# Patient Record
Sex: Male | Born: 1960 | Race: Black or African American | Hispanic: No | Marital: Married | Smoking: Former smoker
Health system: Southern US, Community
[De-identification: ages and names within clinical notes are randomized; demographics above are authoritative.]

## PROBLEM LIST (undated history)

## (undated) DIAGNOSIS — E291 Testicular hypofunction: Secondary | ICD-10-CM

## (undated) DIAGNOSIS — Z973 Presence of spectacles and contact lenses: Secondary | ICD-10-CM

## (undated) DIAGNOSIS — E785 Hyperlipidemia, unspecified: Secondary | ICD-10-CM

## (undated) DIAGNOSIS — J189 Pneumonia, unspecified organism: Secondary | ICD-10-CM

## (undated) DIAGNOSIS — T7840XA Allergy, unspecified, initial encounter: Secondary | ICD-10-CM

## (undated) HISTORY — DX: Presence of spectacles and contact lenses: Z97.3

## (undated) HISTORY — DX: Testicular hypofunction: E29.1

## (undated) HISTORY — DX: Pneumonia, unspecified organism: J18.9

## (undated) HISTORY — DX: Hyperlipidemia, unspecified: E78.5

## (undated) HISTORY — DX: Allergy, unspecified, initial encounter: T78.40XA

---

## 2004-01-16 ENCOUNTER — Emergency Department (HOSPITAL_COMMUNITY): Admission: EM | Admit: 2004-01-16 | Discharge: 2004-01-16 | Payer: Self-pay | Admitting: Emergency Medicine

## 2011-10-05 ENCOUNTER — Ambulatory Visit (INDEPENDENT_AMBULATORY_CARE_PROVIDER_SITE_OTHER): Payer: PRIVATE HEALTH INSURANCE | Admitting: Medical

## 2011-10-05 ENCOUNTER — Other Ambulatory Visit: Payer: Self-pay

## 2011-10-05 ENCOUNTER — Encounter: Payer: Self-pay | Admitting: Medical

## 2011-10-05 VITALS — BP 110/78 | HR 88 | Temp 98.6°F | Resp 16 | Ht 67.0 in | Wt 154.0 lb

## 2011-10-05 DIAGNOSIS — E291 Testicular hypofunction: Secondary | ICD-10-CM

## 2011-10-05 DIAGNOSIS — K029 Dental caries, unspecified: Secondary | ICD-10-CM

## 2011-10-05 DIAGNOSIS — R5383 Other fatigue: Secondary | ICD-10-CM

## 2011-10-05 DIAGNOSIS — R062 Wheezing: Secondary | ICD-10-CM

## 2011-10-05 DIAGNOSIS — Z0000 Encounter for general adult medical examination without abnormal findings: Secondary | ICD-10-CM

## 2011-10-05 DIAGNOSIS — H539 Unspecified visual disturbance: Secondary | ICD-10-CM

## 2011-10-05 DIAGNOSIS — Z125 Encounter for screening for malignant neoplasm of prostate: Secondary | ICD-10-CM

## 2011-10-05 DIAGNOSIS — R5381 Other malaise: Secondary | ICD-10-CM

## 2011-10-05 DIAGNOSIS — F17200 Nicotine dependence, unspecified, uncomplicated: Secondary | ICD-10-CM

## 2011-10-05 LAB — CBC WITH DIFFERENTIAL/PLATELET
HCT: 47.5 % (ref 39.0–52.0)
Hemoglobin: 16 g/dL (ref 13.0–17.0)
Lymphocytes Relative: 53 % — ABNORMAL HIGH (ref 12–46)
Monocytes Absolute: 0.3 10*3/uL (ref 0.1–1.0)
Monocytes Relative: 7 % (ref 3–12)
Neutro Abs: 1.5 10*3/uL — ABNORMAL LOW (ref 1.7–7.7)
WBC: 3.9 10*3/uL — ABNORMAL LOW (ref 4.0–10.5)

## 2011-10-05 LAB — TESTOSTERONE: Testosterone: 466.75 ng/dL (ref 250–890)

## 2011-10-05 LAB — LIPID PANEL
HDL: 54 mg/dL (ref >39)
LDL Cholesterol: 167 mg/dL — ABNORMAL HIGH (ref 0–99)
Triglycerides: 79 mg/dL (ref <150)
VLDL: 16 mg/dL (ref 0–40)

## 2011-10-05 LAB — POCT URINALYSIS DIPSTICK
Bilirubin, UA: NEGATIVE
Blood, UA: NEGATIVE
Ketones, UA: NEGATIVE
Nitrite, UA: NEGATIVE
pH, UA: 5

## 2011-10-05 LAB — COMPREHENSIVE METABOLIC PANEL
BUN: 10 mg/dL (ref 6–23)
CO2: 22 mEq/L (ref 19–32)
Calcium: 9.7 mg/dL (ref 8.4–10.5)
Chloride: 105 mEq/L (ref 96–112)
Creat: 0.99 mg/dL (ref 0.50–1.35)

## 2011-10-05 MED ORDER — AZITHROMYCIN 500 MG PO TABS: 500.0000 mg | ORAL_TABLET | Freq: Every day | ORAL | Status: AC

## 2011-10-05 MED ORDER — ALBUTEROL SULFATE HFA 108 (90 BASE) MCG/ACT IN AERS: 2.0000 | INHALATION_SPRAY | Freq: Four times a day (QID) | RESPIRATORY_TRACT | Status: AC | PRN

## 2011-10-05 NOTE — Progress Notes (Signed)
Subjective:   HPI  Russell Sharp is a 51 y.o. male who presents for a complete physical.  New patient today.  Was seeing Urgent Care prior.  He is a some time cigar smoker, notes hx/o frequent respiratory illness.  Lately been having cough about 5 days ago, some SOB and fatigue.  Had low grade fever last week, some wheezing.  He notes that he does get some wheezing in spring with pollen.  Denis hx/o asthma.  No prior eval for asthma.   He also wonders if the fatigue is related to his testosterone.  He has a hx/o low testosterone, used Androgel briefly in the past before he lost his insurance.  Otherwise has been in good health.     Last preventative care over 1 year ago, last Tdap about 2 years ago, last dental visit 2010, last eye visit - never.  Reviewed their medical, surgical, family, social, medication, and allergy history and updated chart as appropriate.  Past Medical History  Diagnosis Date  . Pneumonia     hospitalized age 59  . Hypogonadism male     prior Androgel use x 28mo  . Chest pain 2008    EKG and stress testing    History reviewed. No pertinent past surgical history.  Family History  Problem Relation Age of Onset  . Cancer Mother     died of lung cancer  . Hypertension Mother   . Other Father     died in the [employer]  . Diabetes Father   . Hyperlipidemia Brother   . Cancer Maternal Aunt     died of lung cancer  . Stroke Neg Hx   . Heart disease Neg Hx     History   Social History  . Marital Status: Single    Spouse Name: N/A    Number of Children: N/A  . Years of Education: N/A   Occupational History  . 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 Smoking status: Current Some Day Smoker -- 0.2 packs/day    Types: Cigars  . Smokeless tobacco: Not on file  . Alcohol Use: 0.6 oz/week    1 Glasses of wine per week  . Drug Use: No  . Sexually Active: Not on file   Other Topics Concern  . Not on file    Social History Narrative   Married, 3 children, ages 62yo, 47yo, [place], exercise - walking    No current outpatient prescriptions on file prior to visit.    No Known Allergies  Review of Systems Constitutional: -fever, -chills, -sweats, -unexpected weight change, -anorexia, +fatigue Allergy: -sneezing, -itching, +congestion Dermatology: denies changing moles, rash, lumps, new worrisome lesions ENT: +runny nose, -ear pain, -sore throat, -hoarseness, -sinus pain, -teeth pain, -tinnitus, -hearing loss, -epistaxis Cardiology:  -chest pain, -palpitations, -edema, -orthopnea, -paroxysmal nocturnal dyspnea Respiratory: +cough, -shortness of breath, -dyspnea on exertion, +wheezing, -hemoptysis Gastroenterology: -abdominal pain, -nausea, -vomiting, -diarrhea, -constipation, -blood in stool, -changes in bowel movement, -dysphagia Hematology: -bleeding or bruising problems Musculoskeletal: -arthralgias, -myalgias, -joint swelling, +back pain, -neck pain, -cramping, -gait changes Ophthalmology: -vision changes, -eye redness, -itching, -discharge Urology: -dysuria, -difficulty urinating, -hematuria, -urinary frequency, -urgency, incontinence Neurology: +headache, -weakness, -tingling, -numbness, -speech abnormality, -memory loss, -falls, -dizziness Psychology:  -depressed mood, -agitation, +sleep problems        Objective:   Physical Exam  Filed Vitals:   10/05/11 0927  BP: 110/78  Pulse: 88  Temp: 98.6  F (37 C)  Resp: 16    General appearance: alert, no distress, WD/WN, lean black male Skin: left neck with small pedunculated skin tag, few other small Fugate flat macules, benign appearing HEENT: normocephalic, conjunctiva/corneas normal, sclerae anicteric, PERRLA, EOMi, nares patent, no discharge or erythema, pharynx normal Oral cavity: MMM, tongue normal, teeth with several teeth in obvious decay bilat upper and lower Neck: supple, no lymphadenopathy, no thyromegaly, no masses, normal  ROM, no bruits Chest: non tender, normal shape and expansion Heart: RRR, normal S1, S2, no murmurs Lungs: few scattered wheezes, few upper field rhonchi, no rales Abdomen: +bs, soft, non tender, non distended, no masses, no hepatomegaly, no splenomegaly, no bruits Back: non tender, normal ROM, no scoliosis Musculoskeletal: upper extremities non tender, no obvious deformity, normal ROM throughout, lower extremities non tender, no obvious deformity, normal ROM throughout Extremities: no edema, no cyanosis, no clubbing Pulses: 2+ symmetric, upper and lower extremities, normal cap refill Neurological: alert, oriented x 3, CN2-12 intact, strength normal upper extremities and lower extremities, sensation normal throughout, DTRs 2+ throughout, no cerebellar signs, gait normal Psychiatric: normal affect, behavior normal, pleasant  GU: normal male external genitalia, uncircumcised, nontender, no masses, no hernia, no lymphadenopathy Rectal: anus normal appearing, prostate  WNL, no nodules or tenderness, no obvious hemorrhoids   Assessment and Plan :    Encounter Diagnoses  Name Primary?  . Routine general medical examination at a health care facility Yes  . Hypogonadism male   . Screening for prostate cancer   . Fatigue   . Tobacco use disorder   . Vision changes   . Dental caries   . Wheezing    Physical exam - discussed healthy lifestyle, diet, exercise, preventative care, vaccinations, and addressed their concerns.    Labs today.  He is reportedly UTD on Tdap, will get flu shot in fall.  Advised he see dentist soon for caries and routine exam.  We will refer to ophthalmology for first official vision screening.    Wheezing, fatigue - will treat for bronchitis with Azithromycin and Albuterol inhaler.  Discussed proper use of inhaler.  Advised again to stop tobacco. CXR sent for overread.  Follow-up pending labs

## 2011-10-05 NOTE — Patient Instructions (Signed)
Recommendations today: 1) see an eye doctor for a full vision exam.   2) twice yearly cleanings/visit with dentist 3) i recommend you stop tobacco completely 4) flu shot yearly in October/November 5) we will call with lab results  Preventative Care for Adults, Male       REGULAR HEALTH EXAMS:  A routine yearly physical is a good way to check in with your primary care provider about your health and preventive screening. It is also an opportunity to share updates about your health and any concerns you have, and receive a thorough all-over exam.   Most health insurance companies pay for at least some preventative services.  Check with your health plan for specific coverages.  WHAT PREVENTATIVE SERVICES DO MEN NEED?  Adult men should have their weight and blood pressure checked regularly.   Men age 13 and older should have their cholesterol levels checked regularly.  Beginning at age 77 and continuing to age 51, men should be screened for colorectal cancer.  Certain people should may need continued testing until age 49.  Other cancer screening may include exams for testicular and prostate cancer.  Updating vaccinations is part of preventative care.  Vaccinations help protect against diseases such as the flu.  Lab tests are generally done as part of preventative care to screen for anemia and blood disorders, to screen for problems with the kidneys and liver, to screen for bladder problems, to check blood sugar, and to check your cholesterol level.  Preventative services generally include counseling about diet, exercise, avoiding tobacco, drugs, excessive alcohol consumption, and sexually transmitted infections.    GENERAL RECOMMENDATIONS FOR GOOD HEALTH:  Healthy diet:  Eat a variety of foods, including fruit, vegetables, animal or vegetable protein, such as meat, fish, chicken, and eggs, or beans, lentils, tofu, and grains, such as rice.  Drink plenty of water daily.  Decrease  saturated fat in the diet, avoid lots of red meat, processed foods, sweets, fast foods, and fried foods.  Exercise:  Aerobic exercise helps maintain good heart health. At least 30-40 minutes of moderate-intensity exercise is recommended. For example, a brisk walk that increases your heart rate and breathing. This should be done on most days of the week.   Find a type of exercise or a variety of exercises that you enjoy so that it becomes a part of your daily life.  Examples are running, walking, swimming, water aerobics, and biking.  For motivation and support, explore group exercise such as aerobic class, spin class, Zumba, Yoga,or  martial arts, etc.    Set exercise goals for yourself, such as a certain weight goal, walk or run in a race such as a 5k walk/run.  Speak to your primary care provider about exercise goals.  Disease prevention:  If you smoke or chew tobacco, find out from your caregiver how to quit. It can literally save your life, no matter how long you have been a tobacco user. If you do not use tobacco, never begin.   Maintain a healthy diet and normal weight. Increased weight leads to problems with blood pressure and diabetes.   The Body Mass Index or BMI is a way of measuring how much of your body is fat. Having a BMI above 27 increases the risk of heart disease, diabetes, hypertension, stroke and other problems related to obesity. Your caregiver can help determine your BMI and based on it develop an exercise and dietary program to help you achieve or maintain this important measurement at  a healthful level.  High blood pressure causes heart and blood vessel problems.  Persistent high blood pressure should be treated with medicine if weight loss and exercise do not work.   Fat and cholesterol leaves deposits in your arteries that can block them. This causes heart disease and vessel disease elsewhere in your body.  If your cholesterol is found to be high, or if you have heart  disease or certain other medical conditions, then you may need to have your cholesterol monitored frequently and be treated with medication.   Ask if you should have a stress test if your history suggests this. A stress test is a test done on a treadmill that looks for heart disease. This test can find disease prior to there being a problem.  Avoid drinking alcohol in excess (more than two drinks per day).  Avoid use of street drugs. Do not share needles with anyone. Ask for professional help if you need assistance or instructions on stopping the use of alcohol, cigarettes, and/or drugs.  Brush your teeth twice a day with fluoride toothpaste, and floss once a day. Good oral hygiene prevents tooth decay and gum disease. The problems can be painful, unattractive, and can cause other health problems. Visit your dentist for a routine oral and dental check up and preventive care every 6-12 months.   Look at your skin regularly.  Use a mirror to look at your back. Notify your caregivers of changes in moles, especially if there are changes in shapes, colors, a size larger than a pencil eraser, an irregular border, or development of new moles.  Safety:  Use seatbelts 100% of the time, whether driving or as a passenger.  Use safety devices such as hearing protection if you work in environments with loud noise or significant background noise.  Use safety glasses when doing any work that could send debris in to the eyes.  Use a helmet if you ride a bike or motorcycle.  Use appropriate safety gear for contact sports.  Talk to your caregiver about gun safety.  Use sunscreen with a SPF (or skin protection factor) of 15 or greater.  Lighter skinned people are at a greater risk of skin cancer. Don't forget to also wear sunglasses in order to protect your eyes from too much damaging sunlight. Damaging sunlight can accelerate cataract formation.   Practice safe sex. Use condoms. Condoms are used for birth control and  to help reduce the spread of sexually transmitted infections (or STIs).  Some of the STIs are gonorrhea (the clap), chlamydia, syphilis, trichomonas, herpes, HPV (human papilloma virus) and HIV (human immunodeficiency virus) which causes AIDS. The herpes, HIV and HPV are viral illnesses that have no cure. These can result in disability, cancer and death.   Keep carbon monoxide and smoke detectors in your home functioning at all times. Change the batteries every 6 months or use a model that plugs into the wall.   Vaccinations:  Stay up to date with your tetanus shots and other required immunizations. You should have a booster for tetanus every 10 years. Be sure to get your flu shot every year, since 5%-20% of the [place] population comes down with the flu. The flu vaccine changes each year, so being vaccinated once is not enough. Get your shot in the fall, before the flu season peaks.   Other vaccines to consider:  Pneumococcal vaccine to protect against certain types of pneumonia.  This is normally recommended for adults age 60 or older.  However, adults younger than 49 years old with certain underlying conditions such as diabetes, heart or lung disease should also receive the vaccine.  Shingles vaccine to protect against Varicella Zoster if you are older than age 65, or younger than 49 years old with certain underlying illness.  Hepatitis A vaccine to protect against a form of infection of the liver by a virus acquired from food.  Hepatitis B vaccine to protect against a form of infection of the liver by a virus acquired from blood or body fluids, particularly if you work in health care.  If you plan to travel internationally, check with your local health department for specific vaccination recommendations.  Cancer Screening:  Most routine colon cancer screening begins at the age of 62. On a yearly basis, doctors may provide special easy to use take-home tests to check for hidden blood in the  stool. Sigmoidoscopy or colonoscopy can detect the earliest forms of colon cancer and is life saving. These tests use a small camera at the end of a tube to directly examine the colon. Speak to your caregiver about this at age 84, when routine screening begins (and is repeated every 5 years unless early forms of pre-cancerous polyps or small growths are found).   At the age of 60 men usually start screening for prostate cancer every year. Screening may begin at a younger age for those with higher risk. Those at higher risk include African-Americans or having a family history of prostate cancer. There are two types of tests for prostate cancer:   Prostate-specific antigen (PSA) testing. Recent studies raise questions about prostate cancer using PSA and you should discuss this with your caregiver.   Digital rectal exam (in which your doctor's lubricated and gloved finger feels for enlargement of the prostate through the anus).   Screening for testicular cancer.  Do a monthly exam of your testicles. Gently roll each testicle between your thumb and fingers, feeling for any abnormal lumps. The best time to do this is after a hot shower or bath when the tissues are looser. Notify your caregivers of any lumps, tenderness or changes in size or shape immediately.

## 2011-10-12 ENCOUNTER — Telehealth: Payer: Self-pay | Admitting: Family Medicine

## 2011-10-12 ENCOUNTER — Other Ambulatory Visit: Payer: Self-pay | Admitting: Medical

## 2011-10-12 MED ORDER — ATORVASTATIN CALCIUM 40 MG PO TABS: 40.0000 mg | ORAL_TABLET | Freq: Every day | ORAL | Status: DC

## 2011-10-12 NOTE — Telephone Encounter (Signed)
Message copied by Janeice Robinson on Tue Oct 12, 2011 10:11 AM ------      Message from: Aleen Campi, Kermit Balo      Created: Fri Oct 08, 2011  5:12 PM       pls call [facility] and ask if they can changes the "history" on the CXR.  It says "PE" but should be wheezing, and fatigue.                     PE makes it sound like I was worried about a PE.              Ask if they will reprint. Let the pt know that the CXR was normal.

## 2011-10-12 NOTE — Telephone Encounter (Signed)
Patient was notified of his CXR report. CLS

## 2011-11-01 ENCOUNTER — Encounter: Payer: Self-pay | Admitting: Medical

## 2012-05-12 ENCOUNTER — Encounter: Payer: Self-pay | Admitting: Medical

## 2012-05-12 ENCOUNTER — Ambulatory Visit (INDEPENDENT_AMBULATORY_CARE_PROVIDER_SITE_OTHER): Payer: PRIVATE HEALTH INSURANCE | Admitting: Medical

## 2012-05-12 VITALS — BP 120/80 | HR 80 | Temp 97.5°F | Resp 16 | Wt 158.0 lb

## 2012-05-12 DIAGNOSIS — Z23 Encounter for immunization: Secondary | ICD-10-CM

## 2012-05-12 DIAGNOSIS — E785 Hyperlipidemia, unspecified: Secondary | ICD-10-CM

## 2012-05-12 LAB — LIPID PANEL
Cholesterol: 266 mg/dL — ABNORMAL HIGH (ref 0–200)
HDL: 61 mg/dL (ref >39)
Triglycerides: 112 mg/dL (ref <150)

## 2012-05-12 NOTE — Progress Notes (Signed)
Subjective:   HPI  Russell Sharp is a 51 y.o. male who presents for f/u.  I saw him for physical March 2013.  I started him on Lipitor 40mg  daily at that time and advise diet changes due to his then current diet, tobacco use and LDL >160.  He has since cut out most red meat, is compliant with Lipitor, and fasting here for recheck today.  He did see dentist as advised in march for caries.  Has additional appt planned.  Never got a call about ophthalmology screening.    He did quit cigars.   No other c/o.  The following portions of the patient's history were reviewed and updated as appropriate: allergies, current medications, past family history, past medical history, past social history, past surgical history and problem list.  Past Medical History  Diagnosis Date  . Pneumonia     hospitalized age 31  . Hypogonadism male     prior Androgel use x 53mo  . Chest pain 2008    EKG and stress testing    No Known Allergies   Review of Systems ROS reviewed and was negative other than noted in HPI or above.    Objective:   Physical Exam  General appearance: alert, no distress, WD/WN Heart: RRR, normal S1, S2, no murmurs Lungs: CTA bilaterally, no wheezes, rhonchi, or rales Abdomen: +bs, soft, non tender, non distended, no masses, no hepatomegaly, no splenomegaly   Assessment and Plan :     Encounter Diagnoses  Name Primary?  . Hyperlipidemia Yes  . Need for influenza vaccination    Labs today.  He has made diet changes, is exercising, and compliant with medication.   Goal <130 LDL.  Flu vaccine, VIS and counseling given.    We will refer to ophthalmology for routine screening.  Somehow the referral didn't happen in March as ordered.

## 2012-05-15 ENCOUNTER — Telehealth: Payer: Self-pay | Admitting: Family Medicine

## 2012-05-15 ENCOUNTER — Other Ambulatory Visit: Payer: Self-pay | Admitting: Family Medicine

## 2012-05-15 ENCOUNTER — Other Ambulatory Visit: Payer: Self-pay | Admitting: Medical

## 2012-05-15 MED ORDER — ATORVASTATIN CALCIUM 40 MG PO TABS: 40.0000 mg | ORAL_TABLET | Freq: Every day | ORAL | Status: DC

## 2012-05-15 NOTE — Telephone Encounter (Signed)
I JUST OPEN THIS REFILL ORDER TO CHANGE THE PHARMACY. CLS

## 2012-05-15 NOTE — Telephone Encounter (Signed)
Patient is aware of his appointment at [facility] on 05/22/12 @ 230 pm for a eye exam. CLS [phone]

## 2012-05-22 ENCOUNTER — Telehealth: Payer: Self-pay | Admitting: Medical

## 2012-05-22 NOTE — Telephone Encounter (Signed)
Dr. Dellia Nims office called, patient NO SHOW for appointment today

## 2013-03-10 ENCOUNTER — Emergency Department (HOSPITAL_COMMUNITY): Payer: PRIVATE HEALTH INSURANCE

## 2013-03-10 ENCOUNTER — Emergency Department (HOSPITAL_COMMUNITY)
Admission: EM | Admit: 2013-03-10 | Discharge: 2013-03-11 | Disposition: A | Discharge status: Home / Self Care | Payer: PRIVATE HEALTH INSURANCE | Attending: Emergency Medicine | Admitting: Emergency Medicine

## 2013-03-10 ENCOUNTER — Encounter (HOSPITAL_COMMUNITY): Payer: Self-pay | Admitting: Emergency Medicine

## 2013-03-10 DIAGNOSIS — Y92009 Unspecified place in unspecified non-institutional (private) residence as the place of occurrence of the external cause: Secondary | ICD-10-CM | POA: Insufficient documentation

## 2013-03-10 DIAGNOSIS — Z862 Personal history of diseases of the blood and blood-forming organs and certain disorders involving the immune mechanism: Secondary | ICD-10-CM | POA: Insufficient documentation

## 2013-03-10 DIAGNOSIS — Z8639 Personal history of other endocrine, nutritional and metabolic disease: Secondary | ICD-10-CM | POA: Insufficient documentation

## 2013-03-10 DIAGNOSIS — Z8701 Personal history of pneumonia (recurrent): Secondary | ICD-10-CM | POA: Insufficient documentation

## 2013-03-10 DIAGNOSIS — S82122A Displaced fracture of lateral condyle of left tibia, initial encounter for closed fracture: Secondary | ICD-10-CM

## 2013-03-10 DIAGNOSIS — S82009A Unspecified fracture of unspecified patella, initial encounter for closed fracture: Secondary | ICD-10-CM | POA: Insufficient documentation

## 2013-03-10 DIAGNOSIS — W1789XA Other fall from one level to another, initial encounter: Secondary | ICD-10-CM | POA: Insufficient documentation

## 2013-03-10 DIAGNOSIS — F17200 Nicotine dependence, unspecified, uncomplicated: Secondary | ICD-10-CM | POA: Insufficient documentation

## 2013-03-10 DIAGNOSIS — Y9339 Activity, other involving climbing, rappelling and jumping off: Secondary | ICD-10-CM | POA: Insufficient documentation

## 2013-03-10 MED ORDER — OXYCODONE-ACETAMINOPHEN 5-325 MG PO TABS
1.0000 | ORAL_TABLET | Freq: Once | ORAL | Status: AC
  Administered 2013-03-10: 1 via ORAL
  Filled 2013-03-10: qty 1

## 2013-03-10 NOTE — ED Provider Notes (Signed)
CSN: [id number]     Arrival date & time 03/10/13  2201 History     First MD Initiated Contact with Patient 03/10/13 2210     Chief Complaint  Patient presents with  . Knee Pain   (Consider location/radiation/quality/duration/timing/severity/associated sxs/prior Treatment) The history is provided by the patient. No language interpreter was used.  Russell Sharp is a 52 y/o M presenting to the ED with left leg pain after jumping over the railing of the porch of a friend's house at approximately 10:30-10:45Pm today. Patient reported that he landed on the ground in an awkward manner that led to instantaneous pain. Patient reported that there is constant pain to the left knee, circumferentially, described as a constant throbbing sensation without radiation. Patient reported that motion makes the pain worse with sitting still making the pain better. Patient reported that he took nothing for the pain, immediately came to the ED after the event. Stated that he was drinking a couple of beers tonight, denied illicit drug use. Denied numbness, tingling, loss of sensation, chest pain, shortness of breath, difficult breathing.  PCP Dr. Susann Givens   Past Medical History  Diagnosis Date  . Pneumonia     hospitalized age 18  . Hypogonadism male     prior Androgel use x 15mo  . Chest pain 2008    EKG and stress testing   History reviewed. No pertinent past surgical history. Family History  Problem Relation Age of Onset  . Cancer Mother     died of lung cancer  . Hypertension Mother   . Other Father     died in the [employer]  . Diabetes Father   . Hyperlipidemia Brother   . Cancer Maternal Aunt     died of lung cancer  . Stroke Neg Hx   . Heart disease Neg Hx    History  Substance Use Topics  . Smoking status: Current Some Day Smoker -- 0.25 packs/day    Types: Cigars  . Smokeless tobacco: Not on file  . Alcohol Use: 0.6 oz/week    1 Glasses of wine per week    Review of Systems   Constitutional: Negative for fever and chills.  HENT: Negative for neck pain and neck stiffness.   Respiratory: Negative for chest tightness and shortness of breath.   Cardiovascular: Negative for chest pain.  Musculoskeletal: Positive for arthralgias (left knee).  Neurological: Negative for dizziness, weakness, numbness and headaches.  All other systems reviewed and are negative.    Allergies  Review of patient's allergies indicates no known allergies.  Home Medications   Current Outpatient Rx  Name  Route  Sig  Dispense  Refill  . HYDROcodone-acetaminophen (NORCO) 5-325 MG per tablet   Oral   Take 1 tablet by mouth every 8 (eight) hours as needed for pain.   9 tablet   0    BP 135/94  Pulse 104  Temp(Src) 97.8 F (36.6 C) (Oral)  Resp 22  SpO2 100% Physical Exam  Nursing note and vitals reviewed. Constitutional: He is oriented to person, place, and time. He appears well-developed and well-nourished. No distress.  HENT:  Head: Normocephalic and atraumatic.  Eyes: Conjunctivae are normal.  Neck: Normal range of motion. Neck supple.  Negative neck stiffness Negative nuchal rigidity Negative pain upon palpation to the cervical spine  Cardiovascular: Normal rate, regular rhythm and normal heart sounds.  Exam reveals no friction rub.   No murmur heard. Pulses:      Radial pulses are 2+  on the right side, and 2+ on the left side.       Dorsalis pedis pulses are 2+ on the right side, and 2+ on the left side.  Pulmonary/Chest: Effort normal and breath sounds normal. No respiratory distress. He has no wheezes. He has no rales.  Musculoskeletal: He exhibits tenderness.  Mild swelling noted to the left knee when compared to the right knee - swelling noted to the anterior lateral aspect of the left knee.Tenderness upon palpation to the left knee - circumferential. Negative signs of effusions Negative anterior and posterior draw sign Mild tenderness with valgus and varus  tension Pain noted upon palpation to the lateral joint line Decreased flexion noted to the left knee secondary to pain  Full range of motion to the left ankle, foot, toes  Lymphadenopathy:    He has no cervical adenopathy.  Neurological: He is alert and oriented to person, place, and time. He exhibits normal muscle tone. Coordination normal.  Strength 5+/5+ with resistance to upper and lower extremities bilaterally Sensation intact to upper and lower extremities bilaterally with differentiation to sharp and dull touch   Skin: Skin is warm and dry. No rash noted. He is not diaphoretic. No erythema.  Psychiatric: He has a normal mood and affect. His behavior is normal. Thought content normal.    ED Course   Procedures (including critical care time)  Labs Reviewed - No data to display Dg Tibia/fibula Left  03/11/2013   *RADIOLOGY REPORT*  Clinical Data: 52 year old male status post blunt trauma with pain.  LEFT TIBIA AND FIBULA - 2 VIEW  Comparison: None.  Findings: Bone mineralization is within normal limits.  Left fibula appears intact.  Grossly normal alignment at the right ankle. Calcaneus appears intact.  Longitudinal mildly displaced left lateral tibial plateau fracture. Probable suprapatellar joint effusion.  The distal femur and patella appear remain intact.  IMPRESSION: Left lateral tibial plateau fracture with mild displacement.  Left fibula and distal left tibia intact.   Original Report Authenticated By: [name], M.D.   Dg Ankle Complete Left  03/11/2013   *RADIOLOGY REPORT*  Clinical Data: Fall, ankle pain  LEFT ANKLE COMPLETE - 3+ VIEW  Comparison: Concurrently obtained radiographs of the knee and tib- fib  Findings: Three views of the left ankle demonstrate no acute fracture, malalignment or joint effusion.  Normal bony mineralization.  No lytic or blastic osseous lesion.  IMPRESSION: Negative left ankle radiographs.   Original Report Authenticated By: [name], M.D.    Dg Knee Complete 4 Views Left  03/11/2013   *RADIOLOGY REPORT*  Clinical Data: 52 year old male with pain after blunt trauma.  LEFT KNEE - COMPLETE 4+ VIEW  Comparison: Left tib-fib series from the same day reported carefully.  Findings: Mildly comminuted and depressed left lateral tibial plateau fracture.  Depression and slight lateral distraction of 2-3 mm each.  Associated hemarthrosis.  Distal left femur, patella, and proximal fibula appear intact.  IMPRESSION: Mildly comminuted and depressed left lateral tibial plateau fracture with hemarthrosis.   Original Report Authenticated By: [name], M.D.   1. Closed fracture of lateral portion of left tibial plateau, initial encounter     MDM  Patient presenting to the ED with left knee pain after jumping off a railing, resulting in left knee pain. Alert and oriented. Swelling noted to the left knee when compared to the right knee, pain upon palpation to the left knee is circumferential. Decreased flexion of the left knee secondary to pain.  Pain localized to the left lateral lateral joint line. Pulses palpable. Negative neurovascular damage noted. Sensation intact with differentiation are sharp and dull touch. Strength intact. Negative puncture wounds noted, negative lacerations - negative signs of open fracture.  Imaging of the left knee noted mildly comminuted and depressed left lateral tibial plateau fracture with hemarthrosis - closed fracture.  Patient afebrile, stable. Pain controlled in ED setting. Patient placed in knee immobilizer and crutches given. Discussed with patient findings and imaging given to patient. Discussed importance of non-weight bearing. Discussed with patient to rest and stay hydrated. Discussed with patient to avoid any strenuous activity. Referred to orthopedics and discussed with patient that he needs to follow-up with them first thing Monday morning-discussed with patient that he may need further imaging, such as an MRI  to be performed to rule out ligamental injury, discussed the patient that he may need possible surgery for repair. Discharged patient with small dose of pain medications - discussed with patient precautions, course, and disposal technique. Discussed with patient to continue to monitor symptoms and if symptoms are to worsen or change to report back to the ED - strict return instructions given.  Patient agreed to plan of care, understood, all questions answered.   Raymon Mutton, PA-C 03/11/13 0220  Raymon Mutton, PA-C 03/11/13 0403

## 2013-03-10 NOTE — ED Notes (Signed)
Pt was jumping over a rail, landed on both feet but more weight centered on left foot. Sharp, needle-like pain to anterior left knee. Pt reports unable to bear weight on left leg.

## 2013-03-10 NOTE — ED Notes (Signed)
Pt requesting pain medication-PA aware. 

## 2013-03-11 MED ORDER — HYDROCODONE-ACETAMINOPHEN 5-325 MG PO TABS: 1.0000 | ORAL_TABLET | Freq: Three times a day (TID) | ORAL | Status: DC | PRN

## 2013-03-11 NOTE — ED Provider Notes (Signed)
Medical screening examination/treatment/procedure(s) were conducted as a shared visit with non-physician practitioner(s) and myself.  I personally evaluated the patient during the encounter Pt c/o jumping off a rail, noted left knee pain. Tibial plat fx on xr. Distal pulses palp. Knee grossly stable. Pain controlled w meds.   Suzi Roots, MD 03/11/13 [phone]

## 2013-03-13 ENCOUNTER — Encounter (HOSPITAL_COMMUNITY): Payer: Self-pay | Admitting: Pharmacy Technician

## 2013-03-13 ENCOUNTER — Encounter (HOSPITAL_COMMUNITY): Payer: Self-pay | Admitting: *Deleted

## 2013-03-13 MED ORDER — CHLORHEXIDINE GLUCONATE 4 % EX LIQD: 60.0000 mL | Freq: Once | CUTANEOUS | Status: DC

## 2013-03-13 MED ORDER — CEFAZOLIN SODIUM-DEXTROSE 2-3 GM-% IV SOLR
2.0000 g | INTRAVENOUS | Status: AC
  Administered 2013-03-14: 2 g via INTRAVENOUS
  Filled 2013-03-13: qty 50

## 2013-03-13 NOTE — H&P (Signed)
  Russell Sharp is an 52 y.o. male.    Chief Complaint: left knee pain/injury  HPI: 52 y/o male jumped from a balcony 3 days ago injuring left knee/leg. Unable to bear weight. Diagnosed with tibial plateau fracture in the emergency room and referred to our office. Recommend surgical management to repair left knee. All questions encouraged and answered and patient in agreement with plan.  PCP:  Carollee Herter, MD  PMH: Past Medical History  Diagnosis Date  . Pneumonia     hospitalized age 47  . Hypogonadism male     prior Androgel use x 23mo  . Chest pain 2008    EKG and stress testing    PSH: No past surgical history on file.  Social History:  reports that he has been smoking Cigars.  He does not have any smokeless tobacco history on file. He reports that he drinks about 0.6 ounces of alcohol per week. He reports that he does not use illicit drugs.  Allergies:  No Known Allergies  Medications: No current facility-administered medications for this encounter.   Current Outpatient Prescriptions  Medication Sig Dispense Refill  . HYDROcodone-acetaminophen (NORCO) 5-325 MG per tablet Take 1 tablet by mouth every 8 (eight) hours as needed for pain.  9 tablet  0    No results found for this or any previous visit (from the past 48 hour(s)). No results found.  ROS: Unable to bear weight Denies previous problems with left knee in the past  Physical Exam: Alert and appropriate 52 y/o male in no acute distress Full rom of cervical and lumbar spines with no deformity Full rom of bilateral upper extremities without deformity Hips/pelvis stable Left lower leg: mild effusion left knee very limited rom due to pain nv intact distally No rashes or edema Pt non weight bearing  Assessment/Plan Assessment: Left tibial plateau fracture with displacement  Plan:  Admit for surgical management to restore function and decrease pain All questions encouraged and answered

## 2013-03-14 ENCOUNTER — Encounter (HOSPITAL_COMMUNITY): Payer: Self-pay | Admitting: *Deleted

## 2013-03-14 ENCOUNTER — Encounter (HOSPITAL_COMMUNITY): Payer: Self-pay | Admitting: Anesthesiology

## 2013-03-14 ENCOUNTER — Ambulatory Visit (HOSPITAL_COMMUNITY): Payer: PRIVATE HEALTH INSURANCE

## 2013-03-14 ENCOUNTER — Ambulatory Visit (HOSPITAL_COMMUNITY): Payer: PRIVATE HEALTH INSURANCE | Admitting: Anesthesiology

## 2013-03-14 ENCOUNTER — Inpatient Hospital Stay (HOSPITAL_COMMUNITY)
Admission: RE | Admit: 2013-03-14 | Discharge: 2013-03-15 | DRG: 494 | Disposition: A | Discharge status: Home / Self Care | Payer: PRIVATE HEALTH INSURANCE | Source: Ambulatory Visit | Attending: Orthopedic Surgery | Admitting: Orthopedic Surgery

## 2013-03-14 ENCOUNTER — Encounter (HOSPITAL_COMMUNITY)
Admission: RE | Disposition: A | Discharge status: Home / Self Care | Payer: Self-pay | Source: Ambulatory Visit | Attending: Orthopedic Surgery

## 2013-03-14 DIAGNOSIS — F17200 Nicotine dependence, unspecified, uncomplicated: Secondary | ICD-10-CM | POA: Diagnosis present

## 2013-03-14 DIAGNOSIS — S82109A Unspecified fracture of upper end of unspecified tibia, initial encounter for closed fracture: Principal | ICD-10-CM | POA: Diagnosis present

## 2013-03-14 DIAGNOSIS — W1789XA Other fall from one level to another, initial encounter: Secondary | ICD-10-CM | POA: Diagnosis present

## 2013-03-14 DIAGNOSIS — S82142A Displaced bicondylar fracture of left tibia, initial encounter for closed fracture: Secondary | ICD-10-CM

## 2013-03-14 HISTORY — PX: ORIF TIBIA PLATEAU: SHX2132

## 2013-03-14 LAB — CBC
HCT: 41.1 % (ref 39.0–52.0)
MCV: 86 fL (ref 78.0–100.0)
RBC: 4.78 MIL/uL (ref 4.22–5.81)
WBC: 5.6 10*3/uL (ref 4.0–10.5)

## 2013-03-14 SURGERY — OPEN REDUCTION INTERNAL FIXATION (ORIF) TIBIAL PLATEAU
Anesthesia: General | Site: Knee | Laterality: Left | Wound class: Clean

## 2013-03-14 MED ORDER — FENTANYL CITRATE 0.05 MG/ML IJ SOLN
INTRAMUSCULAR | Status: DC | PRN
  Administered 2013-03-14 (×3): 50 ug via INTRAVENOUS
  Administered 2013-03-14: 100 ug via INTRAVENOUS
  Administered 2013-03-14 (×2): 50 ug via INTRAVENOUS

## 2013-03-14 MED ORDER — LIDOCAINE HCL (CARDIAC) 20 MG/ML IV SOLN
INTRAVENOUS | Status: DC | PRN
  Administered 2013-03-14: 40 mg via INTRAVENOUS

## 2013-03-14 MED ORDER — FENTANYL CITRATE 0.05 MG/ML IJ SOLN
INTRAMUSCULAR | Status: AC
  Filled 2013-03-14: qty 2

## 2013-03-14 MED ORDER — METOCLOPRAMIDE HCL 5 MG PO TABS: 5.0000 mg | ORAL_TABLET | Freq: Three times a day (TID) | ORAL | Status: DC | PRN

## 2013-03-14 MED ORDER — ENOXAPARIN SODIUM 40 MG/0.4ML ~~LOC~~ SOLN: 40.0000 mg | SUBCUTANEOUS | Status: DC

## 2013-03-14 MED ORDER — MIDAZOLAM HCL 2 MG/2ML IJ SOLN
INTRAMUSCULAR | Status: AC
  Filled 2013-03-14: qty 2

## 2013-03-14 MED ORDER — HYDROMORPHONE HCL PF 1 MG/ML IJ SOLN
INTRAMUSCULAR | Status: AC
  Filled 2013-03-14: qty 1

## 2013-03-14 MED ORDER — LACTATED RINGERS IV SOLN
INTRAVENOUS | Status: DC | PRN
  Administered 2013-03-14 (×2): via INTRAVENOUS

## 2013-03-14 MED ORDER — 0.9 % SODIUM CHLORIDE (POUR BTL) OPTIME
TOPICAL | Status: DC | PRN
  Administered 2013-03-14: 1000 mL

## 2013-03-14 MED ORDER — CEFAZOLIN SODIUM-DEXTROSE 2-3 GM-% IV SOLR
2.0000 g | Freq: Four times a day (QID) | INTRAVENOUS | Status: AC
  Administered 2013-03-15 (×3): 2 g via INTRAVENOUS
  Filled 2013-03-14 (×3): qty 50

## 2013-03-14 MED ORDER — ROCURONIUM BROMIDE 100 MG/10ML IV SOLN
INTRAVENOUS | Status: DC | PRN
  Administered 2013-03-14: 40 mg via INTRAVENOUS

## 2013-03-14 MED ORDER — MIDAZOLAM HCL 5 MG/ML IJ SOLN
1.5000 mg | Freq: Once | INTRAMUSCULAR | Status: DC
  Administered 2013-03-14: 1.5 mg via INTRAVENOUS

## 2013-03-14 MED ORDER — ONDANSETRON HCL 4 MG/2ML IJ SOLN
INTRAMUSCULAR | Status: DC | PRN
  Administered 2013-03-14: 4 mg via INTRAVENOUS

## 2013-03-14 MED ORDER — GLYCOPYRROLATE 0.2 MG/ML IJ SOLN
INTRAMUSCULAR | Status: DC | PRN
  Administered 2013-03-14: .5 mg via INTRAVENOUS

## 2013-03-14 MED ORDER — METOCLOPRAMIDE HCL 5 MG/ML IJ SOLN: 5.0000 mg | Freq: Three times a day (TID) | INTRAMUSCULAR | Status: DC | PRN

## 2013-03-14 MED ORDER — NEOSTIGMINE METHYLSULFATE 1 MG/ML IJ SOLN
INTRAMUSCULAR | Status: DC | PRN
  Administered 2013-03-14: 3 mg via INTRAVENOUS

## 2013-03-14 MED ORDER — PROPOFOL 10 MG/ML IV BOLUS
INTRAVENOUS | Status: DC | PRN
  Administered 2013-03-14: 200 mg via INTRAVENOUS

## 2013-03-14 MED ORDER — ONDANSETRON HCL 4 MG/2ML IJ SOLN
4.0000 mg | Freq: Four times a day (QID) | INTRAMUSCULAR | Status: DC | PRN
  Administered 2013-03-15: 4 mg via INTRAVENOUS
  Filled 2013-03-14: qty 2

## 2013-03-14 MED ORDER — SODIUM CHLORIDE 0.9 % IV SOLN
INTRAVENOUS | Status: DC
  Administered 2013-03-15: 06:00:00 via INTRAVENOUS

## 2013-03-14 MED ORDER — OXYCODONE-ACETAMINOPHEN 5-325 MG PO TABS
1.0000 | ORAL_TABLET | ORAL | Status: DC | PRN
  Administered 2013-03-15 (×3): 2 via ORAL
  Filled 2013-03-14 (×3): qty 2

## 2013-03-14 MED ORDER — METHOCARBAMOL 500 MG PO TABS
500.0000 mg | ORAL_TABLET | Freq: Four times a day (QID) | ORAL | Status: DC | PRN
  Administered 2013-03-15: 500 mg via ORAL
  Filled 2013-03-14: qty 1

## 2013-03-14 MED ORDER — HYDROMORPHONE HCL PF 1 MG/ML IJ SOLN
0.2500 mg | INTRAMUSCULAR | Status: DC | PRN
  Administered 2013-03-14 (×4): 0.5 mg via INTRAVENOUS

## 2013-03-14 MED ORDER — HYDROMORPHONE HCL PF 1 MG/ML IJ SOLN
0.5000 mg | INTRAMUSCULAR | Status: DC | PRN
Start: 2013-03-14 — End: 2013-03-15
  Administered 2013-03-14 – 2013-03-15 (×3): 1 mg via INTRAVENOUS
  Filled 2013-03-14 (×3): qty 1

## 2013-03-14 MED ORDER — METHOCARBAMOL 100 MG/ML IJ SOLN
500.0000 mg | Freq: Four times a day (QID) | INTRAVENOUS | Status: DC | PRN
  Filled 2013-03-14: qty 5

## 2013-03-14 MED ORDER — ONDANSETRON HCL 4 MG PO TABS: 4.0000 mg | ORAL_TABLET | Freq: Four times a day (QID) | ORAL | Status: DC | PRN

## 2013-03-14 MED ORDER — MIDAZOLAM HCL 5 MG/5ML IJ SOLN
INTRAMUSCULAR | Status: DC | PRN
  Administered 2013-03-14 (×2): 1 mg via INTRAVENOUS

## 2013-03-14 MED ORDER — FENTANYL CITRATE 0.05 MG/ML IJ SOLN
75.0000 ug | Freq: Once | INTRAMUSCULAR | Status: AC
  Administered 2013-03-14: 75 ug via INTRAVENOUS

## 2013-03-14 MED ORDER — ARTIFICIAL TEARS OP OINT
TOPICAL_OINTMENT | OPHTHALMIC | Status: DC | PRN
  Administered 2013-03-14: 1 via OPHTHALMIC

## 2013-03-14 SURGICAL SUPPLY — 93 items
BANDAGE ELASTIC 4 VELCRO ST LF (GAUZE/BANDAGES/DRESSINGS) ×2 IMPLANT
BANDAGE ELASTIC 6 VELCRO ST LF (GAUZE/BANDAGES/DRESSINGS) ×2 IMPLANT
BANDAGE ESMARK 6X9 LF (GAUZE/BANDAGES/DRESSINGS) ×1 IMPLANT
BANDAGE GAUZE ELAST BULKY 4 IN (GAUZE/BANDAGES/DRESSINGS) ×2 IMPLANT
BIT DRILL 100X2.5XANTM LCK (BIT) ×1 IMPLANT
BIT DRILL CAL (BIT) ×1 IMPLANT
BIT DRILL SLEEVE MEASURING (BIT) ×1 IMPLANT
BIT DRL 100X2.5XANTM LCK (BIT) ×1
BLADE SURG 10 STRL SS (BLADE) IMPLANT
BLADE SURG 15 STRL LF DISP TIS (BLADE) ×1 IMPLANT
BLADE SURG 15 STRL SS (BLADE) ×2
BLADE SURG ROTATE 9660 (MISCELLANEOUS) IMPLANT
BNDG CMPR 9X6 STRL LF SNTH (GAUZE/BANDAGES/DRESSINGS) ×1
BNDG COHESIVE 4X5 TAN STRL (GAUZE/BANDAGES/DRESSINGS) ×2 IMPLANT
BNDG COHESIVE 6X5 TAN STRL LF (GAUZE/BANDAGES/DRESSINGS) ×2 IMPLANT
BNDG ESMARK 6X9 LF (GAUZE/BANDAGES/DRESSINGS) ×2
BONE CHIP PRESERV 20CC (Bone Implant) ×2 IMPLANT
CANISTER SUCTION 2500CC (MISCELLANEOUS) ×2 IMPLANT
CLEANER TIP ELECTROSURG 2X2 (MISCELLANEOUS) ×2 IMPLANT
CLOTH BEACON ORANGE TIMEOUT ST (SAFETY) ×2 IMPLANT
COVER MAYO STAND STRL (DRAPES) ×2 IMPLANT
CUFF TOURNIQUET SINGLE 34IN LL (TOURNIQUET CUFF) ×2 IMPLANT
CUFF TOURNIQUET SINGLE 44IN (TOURNIQUET CUFF) IMPLANT
DRAPE C-ARM 42X72 X-RAY (DRAPES) ×2 IMPLANT
DRAPE C-ARMOR (DRAPES) ×2 IMPLANT
DRAPE INCISE IOBAN 66X45 STRL (DRAPES) IMPLANT
DRAPE ORTHO SPLIT 77X108 STRL (DRAPES) ×2
DRAPE SURG ORHT 6 SPLT 77X108 (DRAPES) ×1 IMPLANT
DRAPE U-SHAPE 47X51 STRL (DRAPES) ×2 IMPLANT
DRILL BIT 2.5MM (BIT) ×2
DRILL BIT CAL (BIT) ×2
DRILL SLEEVE MEASURING (BIT) ×2
DRSG ADAPTIC 3X8 NADH LF (GAUZE/BANDAGES/DRESSINGS) ×2 IMPLANT
DRSG PAD ABDOMINAL 8X10 ST (GAUZE/BANDAGES/DRESSINGS) ×2 IMPLANT
ELECT REM PT RETURN 9FT ADLT (ELECTROSURGICAL) ×2
ELECTRODE REM PT RTRN 9FT ADLT (ELECTROSURGICAL) ×1 IMPLANT
GLOVE BIOGEL PI ORTHO PRO 7.5 (GLOVE) ×1
GLOVE BIOGEL PI ORTHO PRO SZ8 (GLOVE) ×1
GLOVE ORTHO TXT STRL SZ7.5 (GLOVE) ×2 IMPLANT
GLOVE PI ORTHO PRO STRL 7.5 (GLOVE) ×1 IMPLANT
GLOVE PI ORTHO PRO STRL SZ8 (GLOVE) ×1 IMPLANT
GLOVE SURG ORTHO 8.5 STRL (GLOVE) ×2 IMPLANT
GOWN STRL NON-REIN LRG LVL3 (GOWN DISPOSABLE) ×4 IMPLANT
GOWN STRL REIN XL XLG (GOWN DISPOSABLE) ×4 IMPLANT
IMMOBILIZER KNEE 20 (SOFTGOODS) ×2
IMMOBILIZER KNEE 20 THIGH 36 (SOFTGOODS) ×1 IMPLANT
K-WIRE ACE 1.6X6 (WIRE) ×6
KIT BASIN OR (CUSTOM PROCEDURE TRAY) ×2 IMPLANT
KIT ROOM TURNOVER OR (KITS) ×2 IMPLANT
KWIRE ACE 1.6X6 (WIRE) ×3 IMPLANT
MANIFOLD NEPTUNE II (INSTRUMENTS) IMPLANT
NEEDLE 22X1 1/2 (OR ONLY) (NEEDLE) IMPLANT
NS IRRIG 1000ML POUR BTL (IV SOLUTION) ×2 IMPLANT
PACK ORTHO EXTREMITY (CUSTOM PROCEDURE TRAY) ×2 IMPLANT
PAD ARMBOARD 7.5X6 YLW CONV (MISCELLANEOUS) ×4 IMPLANT
PAD CAST 4YDX4 CTTN HI CHSV (CAST SUPPLIES) ×1 IMPLANT
PADDING CAST COTTON 4X4 STRL (CAST SUPPLIES) ×2
PADDING CAST COTTON 6X4 STRL (CAST SUPPLIES) ×2 IMPLANT
PLATE LOCK 5H STD LT PROX TIB (Plate) ×2 IMPLANT
SCREW CORT FT 32X3.5XNONLOCK (Screw) ×1 IMPLANT
SCREW CORTICAL 3.5MM  32MM (Screw) ×1 IMPLANT
SCREW CORTICAL 3.5MM  34MM (Screw) ×1 IMPLANT
SCREW CORTICAL 3.5MM 32MM (Screw) ×1 IMPLANT
SCREW CORTICAL 3.5MM 34MM (Screw) ×1 IMPLANT
SCREW CORTICAL 3.5MM 36MM (Screw) ×2 IMPLANT
SCREW LOCK CORT STAR 3.5X65 (Screw) ×8 IMPLANT
SCREW LOCK CORT STAR 3.5X70 (Screw) ×6 IMPLANT
SCREW LOW PROF CORTICAL 3.5X80 (Screw) ×2 IMPLANT
SCREW LP 3.5X75MM (Screw) ×2 IMPLANT
SPONGE GAUZE 4X4 12PLY (GAUZE/BANDAGES/DRESSINGS) ×2 IMPLANT
SPONGE LAP 18X18 X RAY DECT (DISPOSABLE) ×2 IMPLANT
STAPLER VISISTAT 35W (STAPLE) IMPLANT
STOCKINETTE IMPERVIOUS LG (DRAPES) ×4 IMPLANT
SUCTION FRAZIER TIP 10 FR DISP (SUCTIONS) ×2 IMPLANT
SUT ETHIBOND 2 0 V5 (SUTURE) IMPLANT
SUT FIBERWIRE 2-0 18 17.9 3/8 (SUTURE)
SUT MNCRL AB 4-0 PS2 18 (SUTURE) IMPLANT
SUT PDS AB 1 CT  36 (SUTURE)
SUT PDS AB 1 CT 36 (SUTURE) IMPLANT
SUT VIC AB 0 CT1 27 (SUTURE) ×4
SUT VIC AB 0 CT1 27XBRD ANBCTR (SUTURE) ×2 IMPLANT
SUT VIC AB 1 CT1 27 (SUTURE) ×4
SUT VIC AB 1 CT1 27XBRD ANBCTR (SUTURE) ×2 IMPLANT
SUT VIC AB 2-0 CT1 27 (SUTURE) ×4
SUT VIC AB 2-0 CT1 TAPERPNT 27 (SUTURE) ×2 IMPLANT
SUTURE FIBERWR 2-0 18 17.9 3/8 (SUTURE) IMPLANT
SYR 20ML ECCENTRIC (SYRINGE) IMPLANT
TOWEL OR 17X24 6PK STRL BLUE (TOWEL DISPOSABLE) ×2 IMPLANT
TOWEL OR 17X26 10 PK STRL BLUE (TOWEL DISPOSABLE) ×2 IMPLANT
TRAY FOLEY CATH 16FRSI W/METER (SET/KITS/TRAYS/PACK) IMPLANT
TUBE CONNECTING 12X1/4 (SUCTIONS) ×2 IMPLANT
WATER STERILE IRR 1000ML POUR (IV SOLUTION) IMPLANT
YANKAUER SUCT BULB TIP NO VENT (SUCTIONS) ×4 IMPLANT

## 2013-03-14 NOTE — Anesthesia Procedure Notes (Addendum)
Anesthesia Regional Block:  Femoral nerve block  Pre-Anesthetic Checklist: ,, timeout performed, Correct Patient, Correct Site, Correct Laterality, Correct Procedure, Correct Position, site marked, Risks and benefits discussed,  Surgical consent,  Pre-op evaluation,  At surgeon's request and post-op pain management  Laterality: Left  Prep: chloraprep       Needles:   Needle Type: Echogenic Stimulator Needle          Additional Needles:  Procedures: Doppler guided and nerve stimulator Femoral nerve block  Nerve Stimulator or Paresthesia:  Response: 0.5 mA,   Additional Responses:   Narrative:  Start time: 03/14/2013 3:30 PM End time: 03/14/2013 3:45 PM Injection made incrementally with aspirations every 5 mL.  Performed by: Personally   Femoral nerve block Procedure Name: Intubation Date/Time: 03/14/2013 5:53 PM Performed by: Gayla Medicus Pre-anesthesia Checklist: Patient identified, Timeout performed, Emergency Drugs available, Patient being monitored and Suction available Patient Re-evaluated:Patient Re-evaluated prior to inductionOxygen Delivery Method: Circle system utilized Preoxygenation: Pre-oxygenation with 100% oxygen Intubation Type: IV induction Ventilation: Mask ventilation without difficulty Laryngoscope Size: Mac and 4 Grade View: Grade I Tube type: Oral Tube size: 7.5 mm Number of attempts: 1 Airway Equipment and Method: Stylet Placement Confirmation: ETT inserted through vocal cords under direct vision,  positive ETCO2 and breath sounds checked- equal and bilateral Secured at: 23 cm Tube secured with: Tape Dental Injury: Teeth and Oropharynx as per pre-operative assessment

## 2013-03-14 NOTE — Transfer of Care (Signed)
Immediate Anesthesia Transfer of Care Note  Patient: Russell Sharp  Procedure(s) Performed: Procedure(s): OPEN REDUCTION INTERNAL FIXATION (ORIF) LEFT TIBIAL PLATEAU FRACTURE  (Left)  Patient Location: PACU  Anesthesia Type:General and GA combined with regional for post-op pain  Level of Consciousness: awake, alert  and oriented  Airway & Oxygen Therapy: Patient Spontanous Breathing and Patient connected to nasal cannula oxygen  Post-op Assessment: Report given to PACU RN and Post -op Vital signs reviewed and stable  Post vital signs: Reviewed and stable  Complications: No apparent anesthesia complications

## 2013-03-14 NOTE — Interval H&P Note (Signed)
History and Physical Interval Note:  03/14/2013 5:36 PM  Russell Sharp  has presented today for surgery, with the diagnosis of LEFT TIBIAL PLATEAU FRACTURE   The various methods of treatment have been discussed with the patient and family. After consideration of risks, benefits and other options for treatment, the patient has consented to  Procedure(s): OPEN REDUCTION INTERNAL FIXATION (ORIF) LEFT TIBIAL PLATEAU FRACTURE  (Left) as a surgical intervention .  The patient's history has been reviewed, patient examined, no change in status, stable for surgery.  I have reviewed the patient's chart and labs.  Questions were answered to the patient's satisfaction.     Gionni Vaca,STEVEN R

## 2013-03-14 NOTE — Anesthesia Postprocedure Evaluation (Signed)
  Anesthesia Post-op Note  Patient: Russell Sharp  Procedure(s) Performed: Procedure(s): OPEN REDUCTION INTERNAL FIXATION (ORIF) LEFT TIBIAL PLATEAU FRACTURE  (Left)  Patient Location: PACU  Anesthesia Type:General and GA combined with regional for post-op pain  Level of Consciousness: awake, alert  and oriented  Airway and Oxygen Therapy: Patient Spontanous Breathing and Patient connected to nasal cannula oxygen  Post-op Pain: mild  Post-op Assessment: Post-op Vital signs reviewed, Patient's Cardiovascular Status Stable, Respiratory Function Stable, Patent Airway and Pain level controlled  Post-op Vital Signs: stable  Complications: No apparent anesthesia complications

## 2013-03-14 NOTE — Preoperative (Signed)
Beta Blockers   Reason not to administer Beta Blockers:Not Applicable 

## 2013-03-14 NOTE — Anesthesia Preprocedure Evaluation (Addendum)
Anesthesia Evaluation  Patient identified by MRN, date of birth, ID band Patient awake    Reviewed: Allergy & Precautions, H&P , NPO status , Patient's Chart, lab work & pertinent test results  Airway Mallampati: II TM Distance: >3 FB Neck ROM: Full  Mouth opening: Limited Mouth Opening  Dental   Pulmonary pneumonia -, resolved,  breath sounds clear to auscultation        Cardiovascular Rhythm:Regular Rate:Normal  Cardiac history noted. Patient asymptomatic presently. CE    Neuro/Psych    GI/Hepatic negative GI ROS, Neg liver ROS,   Endo/Other  negative endocrine ROS  Renal/GU negative Renal ROS     Musculoskeletal   Abdominal   Peds  Hematology   Anesthesia Other Findings   Reproductive/Obstetrics                         Anesthesia Physical Anesthesia Plan  ASA: II  Anesthesia Plan: General   Post-op Pain Management:    Induction: Intravenous  Airway Management Planned: LMA  Additional Equipment:   Intra-op Plan:   Post-operative Plan: Extubation in OR  Informed Consent: I have reviewed the patients History and Physical, chart, labs and discussed the procedure including the risks, benefits and alternatives for the proposed anesthesia with the patient or authorized representative who has indicated his/her understanding and acceptance.   Dental advisory given  Plan Discussed with: CRNA, Anesthesiologist and Surgeon  Anesthesia Plan Comments:      Anesthesia Quick Evaluation

## 2013-03-14 NOTE — Brief Op Note (Signed)
03/14/2013  8:10 PM  PATIENT:  Russell Sharp  52 y.o. male  PRE-OPERATIVE DIAGNOSIS:  LEFT TIBIAL PLATEAU FRACTURE, DISPLACED AND DEPRESSED  POST-OPERATIVE DIAGNOSIS:  LEFT TIBIAL PLATEAU FRACTURE, DISPLACED AND DEPRESSED  PROCEDURE:  Procedure(s): OPEN REDUCTION INTERNAL FIXATION (ORIF) LEFT TIBIAL PLATEAU FRACTURE  (Left), ALLOGRAFT BONE GRAFTING  SURGEON:  Surgeon(s) and Role:    * Verlee Rossetti, MD - Primary  PHYSICIAN ASSISTANT:   ASSISTANTS: Thea Gist, PA-C   ANESTHESIA:   general  EBL:  Total I/O In: 150 [I.V.:150] Out: 50 [Blood:50]  BLOOD ADMINISTERED:none  DRAINS: none   LOCAL MEDICATIONS USED:  NONE  SPECIMEN:  No Specimen  DISPOSITION OF SPECIMEN:  N/A  COUNTS:  YES  TOURNIQUET:   Total Tourniquet Time Documented: Thigh (Left) - 97 minutes Total: Thigh (Left) - 97 minutes   DICTATION: .Other Dictation: Dictation Number [id number]  PLAN OF CARE: Admit to inpatient   PATIENT DISPOSITION:  PACU - hemodynamically stable.   Delay start of Pharmacological VTE agent (>24hrs) due to surgical blood loss or risk of bleeding: no

## 2013-03-15 ENCOUNTER — Encounter (HOSPITAL_COMMUNITY): Payer: Self-pay | Admitting: Orthopedic Surgery

## 2013-03-15 MED ORDER — OXYCODONE-ACETAMINOPHEN 5-325 MG PO TABS: 1.0000 | ORAL_TABLET | ORAL | Status: DC | PRN

## 2013-03-15 MED ORDER — SODIUM CHLORIDE 0.9 % IV BOLUS (SEPSIS)
250.0000 mL | Freq: Once | INTRAVENOUS | Status: AC
  Administered 2013-03-15: 250 mL via INTRAVENOUS

## 2013-03-15 MED ORDER — ASPIRIN EC 81 MG PO TBEC: 81.0000 mg | DELAYED_RELEASE_TABLET | Freq: Every day | ORAL | Status: DC

## 2013-03-15 MED ORDER — METHOCARBAMOL 500 MG PO TABS: 500.0000 mg | ORAL_TABLET | Freq: Four times a day (QID) | ORAL | Status: DC | PRN

## 2013-03-15 NOTE — Discharge Summary (Signed)
Physician Discharge Summary   Patient ID: Russell Sharp MRN: [id number] DOB/AGE: Feb 05, 1961 52 y.o.  Admit date: April 08, 2013 Discharge date: 03/15/2013  Admission Diagnoses:  Left tibial plateau fracture  Discharge Diagnoses:  Same   Surgeries: Procedure(s): OPEN REDUCTION INTERNAL FIXATION (ORIF) LEFT TIBIAL PLATEAU FRACTURE  on 2013/04/08   Consultants: PT  Discharged Condition: Stable  Hospital Course: Russell Sharp is an 52 y.o. male who was admitted 04-08-2013 with a chief complaint of No chief complaint on file. , and found to have a diagnosis of <principal problem not specified>.  They were brought to the operating room on 2013-04-08 and underwent the above named procedures.    The patient had an uncomplicated hospital course and was stable for discharge.  Recent vital signs:  Filed Vitals:   03/15/13 0516  BP: 126/100  Pulse: 121  Temp: 98.4 F (36.9 C)  Resp: 19    Recent laboratory studies:  Results for orders placed during the hospital encounter of 04/08/13  CBC      Result Value Range   WBC 5.6  4.0 - 10.5 K/uL   RBC 4.78  4.22 - 5.81 MIL/uL   Hemoglobin 14.4  13.0 - 17.0 g/dL   HCT 78.2  95.6 - 21.3 %   MCV 86.0  78.0 - 100.0 fL   MCH 30.1  26.0 - 34.0 pg   MCHC 35.0  30.0 - 36.0 g/dL   RDW 08.6  57.8 - 46.9 %   Platelets 193  150 - 400 K/uL    Discharge Medications:     Medication List    STOP taking these medications       HYDROcodone-acetaminophen 5-325 MG per tablet  Commonly known as:  NORCO      TAKE these medications       aspirin EC 81 MG tablet  Take 1 tablet (81 mg total) by mouth daily.     methocarbamol 500 MG tablet  Commonly known as:  ROBAXIN  Take 1 tablet (500 mg total) by mouth every 6 (six) hours as needed.     oxyCODONE-acetaminophen 5-325 MG per tablet  Commonly known as:  PERCOCET/ROXICET  Take 1-2 tablets by mouth every 4 (four) hours as needed.        Diagnostic Studies: Dg Knee 1-2 Views Left  08-Apr-2013    *RADIOLOGY REPORT*  Clinical Data: Tibial plateau fracture.  DG C-ARM 61-120 MIN,LEFT KNEE - 1-2 VIEW  Comparison: Radiographs dated 03/10/2013  Findings: AP and lateral C-arm images demonstrate the patient has undergone open reduction and internal fixation of the depressed fracture of the lateral tibial plateau.  Side plate and multiple screws have been inserted.  The alignment and position of the fracture fragments appears anatomic.  IMPRESSION: Open reduction and internal fixation of left lateral tibial plateau fracture.   Original Report Authenticated By: [name], M.D.   Dg Tibia/fibula Left  03/11/2013   *RADIOLOGY REPORT*  Clinical Data: 52 year old male status post blunt trauma with pain.  LEFT TIBIA AND FIBULA - 2 VIEW  Comparison: None.  Findings: Bone mineralization is within normal limits.  Left fibula appears intact.  Grossly normal alignment at the right ankle. Calcaneus appears intact.  Longitudinal mildly displaced left lateral tibial plateau fracture. Probable suprapatellar joint effusion.  The distal femur and patella appear remain intact.  IMPRESSION: Left lateral tibial plateau fracture with mild displacement.  Left fibula and distal left tibia intact.   Original Report Authenticated By: [name], M.D.   Dg Ankle  Complete Left  03/11/2013   *RADIOLOGY REPORT*  Clinical Data: Fall, ankle pain  LEFT ANKLE COMPLETE - 3+ VIEW  Comparison: Concurrently obtained radiographs of the knee and tib- fib  Findings: Three views of the left ankle demonstrate no acute fracture, malalignment or joint effusion.  Normal bony mineralization.  No lytic or blastic osseous lesion.  IMPRESSION: Negative left ankle radiographs.   Original Report Authenticated By: [name], M.D.   Dg Knee Complete 4 Views Left  03/11/2013   *RADIOLOGY REPORT*  Clinical Data: 52 year old male with pain after blunt trauma.  LEFT KNEE - COMPLETE 4+ VIEW  Comparison: Left tib-fib series from the same day reported  carefully.  Findings: Mildly comminuted and depressed left lateral tibial plateau fracture.  Depression and slight lateral distraction of 2-3 mm each.  Associated hemarthrosis.  Distal left femur, patella, and proximal fibula appear intact.  IMPRESSION: Mildly comminuted and depressed left lateral tibial plateau fracture with hemarthrosis.   Original Report Authenticated By: [name], M.D.   Dg C-arm [phone] Min  03/14/2013   *RADIOLOGY REPORT*  Clinical Data: Tibial plateau fracture.  DG C-ARM 61-120 MIN,LEFT KNEE - 1-2 VIEW  Comparison: Radiographs dated 03/10/2013  Findings: AP and lateral C-arm images demonstrate the patient has undergone open reduction and internal fixation of the depressed fracture of the lateral tibial plateau.  Side plate and multiple screws have been inserted.  The alignment and position of the fracture fragments appears anatomic.  IMPRESSION: Open reduction and internal fixation of left lateral tibial plateau fracture.   Original Report Authenticated By: [name], M.D.    Disposition: 01-Home or Self Care      Discharge Orders   Future Orders Complete By Expires   Call MD / Call 911  As directed    Comments:     If you experience chest pain or shortness of breath, CALL 911 and be transported to the hospital emergency room.  If you develope a fever above 101 F, pus (white drainage) or increased drainage or redness at the wound, or calf pain, call your surgeon's office.   Constipation Prevention  As directed    Comments:     Drink plenty of fluids.  Prune juice may be helpful.  You may use a stool softener, such as Colace (over the counter) 100 mg twice a day.  Use MiraLax (over the counter) for constipation as needed.   Diet - low sodium heart healthy  As directed    Increase activity slowly as tolerated  As directed          Signed: Rishard Delange B 03/15/2013, 8:08 AM

## 2013-03-15 NOTE — Progress Notes (Signed)
   Subjective: 1 Day Post-Op Procedure(s) (LRB): OPEN REDUCTION INTERNAL FIXATION (ORIF) LEFT TIBIAL PLATEAU FRACTURE  (Left)  Mild pain to left knee but overall doing well Patient reports pain as mild.  Objective:   VITALS:   Filed Vitals:   03/15/13 0516  BP: 126/100  Pulse: 121  Temp: 98.4 F (36.9 C)  Resp: 19    Dressing and knee immobilizer in place to left leg nv intact distally  LABS  Recent Labs  03/14/13 1355  HGB 14.4  HCT 41.1  WBC 5.6  PLT 193    No results found for this basename: NA, K, BUN, CREATININE, GLUCOSE,  in the last 72 hours   Assessment/Plan: 1 Day Post-Op Procedure(s) (LRB): OPEN REDUCTION INTERNAL FIXATION (ORIF) LEFT TIBIAL PLATEAU FRACTURE  (Left)  Pt doing well D/c home today F/u in 2 weeks Non weight bearing left lower extremity dvt prophylaxis   Brad Modestine Scherzinger, MPAS, PA-C  03/15/2013, 8:07 AM

## 2013-03-15 NOTE — Care Management Note (Signed)
  Page 1 of 1   03/15/2013     2:01:21 PM   CARE MANAGEMENT NOTE 03/15/2013  Patient:  Russell Sharp,Russell Sharp   Account Number:  [id number]  Date Initiated:  03/15/2013  Documentation initiated by:  Ronny Flurry  Subjective/Objective Assessment:     Action/Plan:   Anticipated DC Date:  03/15/2013   Anticipated DC Plan:           Choice offered to / List presented to:             Status of service:   Medicare Important Message given?   (If response is "NO", the following Medicare IM given date fields will be blank) Date Medicare IM given:   Date Additional Medicare IM given:    Discharge Disposition:    Per UR Regulation:    If discussed at Long Length of Stay Meetings, dates discussed:    Comments:  03-15-13 PT recommending tub transfer bench , same ordered . Per [facility] , patient 's insurance does not cover tub transfer bench and he will have to pay $60 up front . Patient's wife wanting to use her flex card to pay for tub bench , [facility] unable to accept flex card for payment .  Explained this to patient and wife . Both declined getting tub transfer bench . Patient's  wife stated her mother has one and they will use hers .  Ronny Flurry RN BSN [phone]

## 2013-03-15 NOTE — Evaluation (Addendum)
Physical Therapy Evaluation Patient Details Name: Russell Sharp MRN: [id number] DOB: October 29, 1960 Today's Date: 03/15/2013 Time: 4098-1191 PT Time Calculation (min): 34 min  PT Assessment / Plan / Recommendation History of Present Illness  Pt s/p ORIF of fractured L tibial plateau.  Clinical Impression  Patient evaluated by Physical Therapy with no further acute PT needs identified. All education has been completed and the patient has no further questions. Have placed a called to Dr. Talbert Forest PA to get some parameters for wearing immobilizer/ ROM to knee.etc.   See below for any follow-up Physial Therapy or equipment needs. PT is signing off. Thank you for this referral.     PT Assessment  All further PT needs can be met in the next venue of care    Follow Up Recommendations  Supervision - Intermittent;Home health PT (if DR. Ranell Patrick wants follow up PT at this time.)    Does the patient have the potential to tolerate intense rehabilitation      Barriers to Discharge        Equipment Recommendations  Other (comment) (tub transfer bench)    Recommendations for Other Services     Frequency      Precautions / Restrictions Precautions Required Braces or Orthoses: Knee Immobilizer - Left Knee Immobilizer - Left: On at all times;Other (comment) (until otherwise noted) Restrictions Weight Bearing Restrictions: Yes LLE Weight Bearing: Non weight bearing   Pertinent Vitals/Pain       Mobility  Bed Mobility Bed Mobility: Supine to Sit;Sitting - Scoot to Edge of Bed;Sit to Supine Supine to Sit: 7: Independent Sitting - Scoot to Edge of Bed: 7: Independent Sit to Supine: 7: Independent Transfers Transfers: Sit to Stand;Stand to Sit Sit to Stand: 6: Modified independent (Device/Increase time) Stand to Sit: 6: Modified independent (Device/Increase time) Details for Transfer Assistance: good transfer safety after demonstration Ambulation/Gait Ambulation/Gait Assistance: 6: Modified  independent (Device/Increase time) Ambulation Distance (Feet): 150 Feet (times 2, with RW and crutches) Ambulation/Gait Assistance Details: safe and steady Gait Pattern: Step-to pattern Stairs: Yes Stair Management Technique: Step to pattern;Forwards;With crutches Number of Stairs: 2 Wheelchair Mobility Wheelchair Mobility: No    Exercises     PT Diagnosis:    PT Problem List: Decreased range of motion;Decreased strength PT Treatment Interventions:       PT Goals(Current goals can be found in the care plan section)    Visit Information  Last PT Received On: 03/15/13 Assistance Needed: +1 History of Present Illness: Pt s/p ORIF of fractured L tibial plateau.       Prior Functioning  Home Living Family/patient expects to be discharged to:: Private residence Living Arrangements: Spouse/significant other Available Help at Discharge: Family (24/7 until this coming monday) Type of Home: Apartment Home Access: Stairs to enter [employer] of Steps: 12 Entrance Stairs-Rails: Right;Left Home Layout: One level Home Equipment: Crutches Prior Function Level of Independence: Independent Communication Communication: No difficulties    Cognition  Cognition Arousal/Alertness: Awake/alert Overall Cognitive Status: Within Functional Limits for tasks assessed    Extremity/Trunk Assessment Upper Extremity Assessment Upper Extremity Assessment: Overall WFL for tasks assessed Lower Extremity Assessment Lower Extremity Assessment: Overall WFL for tasks assessed (L knee held in slight flexion in the immobilizer) Cervical / Trunk Assessment Cervical / Trunk Assessment: Normal   Balance Balance Balance Assessed: No  End of Session PT - End of Session Equipment Utilized During Treatment: Left knee immobilizer Activity Tolerance: Patient tolerated treatment well Patient left: in chair;with call bell/phone within reach;with family/visitor  present Nurse Communication: Mobility  status  GP     Russell Sharp, Russell Sharp 03/15/2013, 1:37 PM 03/15/2013  [place] Bing, PT [phone]  (pager)

## 2013-03-15 NOTE — Op Note (Signed)
Russell Sharp, KNEE NO.:  [id number]  MEDICAL RECORD NO.:  [id number]  LOCATION:  6N10C                        FACILITY:  [facility]  PHYSICIAN:  Almedia Balls. Ranell Patrick, M.D. DATE OF BIRTH:  August 02, 1960  DATE OF PROCEDURE:  03/14/2013 DATE OF DISCHARGE:                              OPERATIVE REPORT   PREOPERATIVE DIAGNOSIS:  Left displaced and depressed tibial plateau fracture.  POSTOPERATIVE DIAGNOSIS:  Left displaced and depressed tibial plateau fracture.  PROCEDURE PERFORMED:  Open reduction and internal fixation of left displaced and depressed tibial plateau fracture.  ATTENDING SURGEON:  Almedia Balls. Ranell Patrick, MD.  ASSISTANT:  Modesto Charon, PA-C who was scrubbed the entire procedure and necessary for satisfactory completion of surgery.  ANESTHESIA:  General anesthesia was used plus local anesthesia.  ESTIMATED BLOOD LOSS:  Minimal.  FLUID REPLACEMENT:  1200 mL crystalloids.  INSTRUMENT COUNTS:  Correct.  COMPLICATIONS:  There were no complications.  ANTIBIOTICS:  Perioperative antibiotics were given.  The patient had a tourniquet time of 90 minutes at 300 mmHg.  INDICATIONS:  The patient is a 52 year old male who suffered a fall jumping off a fence.  The patient landed on a bent knee and complained of immediate pain.  He presented to Orthopedic Clinic with a very swollen and x-rays demonstrating a split and depressed tibial plateau fracture and was an articular fragment that was depressed at least 5-7 mm.  After counseling the patient regarding recommended options for treatment, I recommended surgical management including open reduction and internal fixation.  The patient agreed and informed consent obtained.  DESCRIPTION OF PROCEDURE:  After adequate level of anesthesia was achieved, the patient was positioned supine on the radiolucent operating table.  Left leg correctly identified.  Nonsterile tourniquet placed to left proximal thigh.  Time-out  was called.  Sterile prep and drape of the knee and leg were performed.  We then went ahead and elevated the leg and exsanguinated using Esmarch bandage.  A longitudinal incision was created starting below the tibial tubercle, just lateral to the crest of the tibia extending up the lateral patellar tendon and up around the lateral portion of the patella.  Dissection down to subcutaneous tissues using Bovie.  We did a full thickness at the bone dissection over the distal portion incision and we also did an arthrotomy proximally but we kept the meniscus intact.  We were able to mobilize the soft tissues subperiosteally off the fractured fragment which we really used to slide a Cobb elevator in there and open that up, and we then used C-arm which was draped into the field, and a bone tamp to elevate the depressed bone fragment with cartilage on.  I could palpate through the arthrotomy with a Therapist, nutritional and feel that piece of bone and cartilage come back up into place.  X-rays verifying with C- arm, and we properly elevated the joint line.  We then backfilled with cancellous allograft chips and impacted that.  We then closed the fracture line back down, applied a lateral locking plate by [employer] just below the joint line, and placed some provisional pins.  We were happy with the joint reduction and the elevation of  that depressed fragment. We then went ahead and placed our nonlocked lag screws initially 2 of those, followed by locking screws, which were basically raft type screws below the fracture fragment, and then distally nonlocked screws holding the plate to the distal tibial fragment.  We also had 1 kickstand screw to the posterior medial portion of the tibia.  We were very pleased with the stability.  We ranged the knee.  The knee was stable in both in extension and flexion.  I was able to take my Therapist, nutritional and run that over the joint line and felt nice and smooth.  We  thoroughly irrigated and closed in layers with 0 Vicryl for the arthrotomy and for the fascia, 2-0 Vicryl subcutaneous closure and staples, sterile compressive bandage and knee immobilizer applied.  The patient tolerated the surgery well.     Almedia Balls. Ranell Patrick, M.D.     SRN/MEDQ  D:  03/14/2013  T:  03/15/2013  Job:  [id number]

## 2013-03-15 NOTE — Progress Notes (Signed)
Discharge instructions given and reviewed with patient and family member. Patient discharged home with family

## 2013-03-15 NOTE — Progress Notes (Signed)
Pt unable to void since arriving on the unit after surgery approximately 10pm.  Pt attempted to void in urinal without success.  Bladder scan showed 200cc's  Residual urine.  Pt's BP 121/100 and pulse 121 this am.  MD paged.

## 2013-03-19 NOTE — Discharge Summary (Signed)
Physician Discharge Summary   Patient ID: Russell Sharp MRN: [id number] DOB/AGE: 11-26-1960 52 y.o.  Admit date: 03/25/2013 Discharge date: 03/15/13  Admission Diagnoses:  Left tibial plateau fracture  Discharge Diagnoses:  Same   Surgeries: Procedure(s): OPEN REDUCTION INTERNAL FIXATION (ORIF) LEFT TIBIAL PLATEAU FRACTURE  on 03/25/13   Consultants: PT/OT  Discharged Condition: Stable  Hospital Course: Russell Sharp is an 52 y.o. male who was admitted 03/25/2013 with a chief complaint of No chief complaint on file. , and found to have a diagnosis of <principal problem not specified>.  They were brought to the operating room on March 25, 2013 and underwent the above named procedures.    The patient had an uncomplicated hospital course and was stable for discharge.  Recent vital signs:  Filed Vitals:   03/15/13 1400  BP: 146/102  Pulse: 120  Temp: 98.6 F (37 C)  Resp: 20    Recent laboratory studies:  Results for orders placed during the hospital encounter of Mar 25, 2013  CBC      Result Value Range   WBC 5.6  4.0 - 10.5 K/uL   RBC 4.78  4.22 - 5.81 MIL/uL   Hemoglobin 14.4  13.0 - 17.0 g/dL   HCT 40.9  81.1 - 91.4 %   MCV 86.0  78.0 - 100.0 fL   MCH 30.1  26.0 - 34.0 pg   MCHC 35.0  30.0 - 36.0 g/dL   RDW 78.2  95.6 - 21.3 %   Platelets 193  150 - 400 K/uL    Discharge Medications:     Medication List    STOP taking these medications       HYDROcodone-acetaminophen 5-325 MG per tablet  Commonly known as:  NORCO      TAKE these medications       aspirin EC 81 MG tablet  Take 1 tablet (81 mg total) by mouth daily.     methocarbamol 500 MG tablet  Commonly known as:  ROBAXIN  Take 1 tablet (500 mg total) by mouth every 6 (six) hours as needed.     oxyCODONE-acetaminophen 5-325 MG per tablet  Commonly known as:  PERCOCET/ROXICET  Take 1-2 tablets by mouth every 4 (four) hours as needed.        Diagnostic Studies: Dg Knee 1-2 Views Left  March 25, 2013    *RADIOLOGY REPORT*  Clinical Data: Tibial plateau fracture.  DG C-ARM 61-120 MIN,LEFT KNEE - 1-2 VIEW  Comparison: Radiographs dated 03/10/2013  Findings: AP and lateral C-arm images demonstrate the patient has undergone open reduction and internal fixation of the depressed fracture of the lateral tibial plateau.  Side plate and multiple screws have been inserted.  The alignment and position of the fracture fragments appears anatomic.  IMPRESSION: Open reduction and internal fixation of left lateral tibial plateau fracture.   Original Report Authenticated By: Russell Sharp, M.D.   Dg Tibia/fibula Left  03/11/2013   *RADIOLOGY REPORT*  Clinical Data: 52 year old male status post blunt trauma with pain.  LEFT TIBIA AND FIBULA - 2 VIEW  Comparison: None.  Findings: Bone mineralization is within normal limits.  Left fibula appears intact.  Grossly normal alignment at the right ankle. Calcaneus appears intact.  Longitudinal mildly displaced left lateral tibial plateau fracture. Probable suprapatellar joint effusion.  The distal femur and patella appear remain intact.  IMPRESSION: Left lateral tibial plateau fracture with mild displacement.  Left fibula and distal left tibia intact.   Original Report Authenticated By: Russell Sharp, M.D.   Dg Ankle  Complete Left  03/11/2013   *RADIOLOGY REPORT*  Clinical Data: Fall, ankle pain  LEFT ANKLE COMPLETE - 3+ VIEW  Comparison: Concurrently obtained radiographs of the knee and tib- fib  Findings: Three views of the left ankle demonstrate no acute fracture, malalignment or joint effusion.  Normal bony mineralization.  No lytic or blastic osseous lesion.  IMPRESSION: Negative left ankle radiographs.   Original Report Authenticated By: Russell Sharp, M.D.   Dg Knee Complete 4 Views Left  03/11/2013   *RADIOLOGY REPORT*  Clinical Data: 52 year old male with pain after blunt trauma.  LEFT KNEE - COMPLETE 4+ VIEW  Comparison: Left tib-fib series from the same day reported  carefully.  Findings: Mildly comminuted and depressed left lateral tibial plateau fracture.  Depression and slight lateral distraction of 2-3 mm each.  Associated hemarthrosis.  Distal left femur, patella, and proximal fibula appear intact.  IMPRESSION: Mildly comminuted and depressed left lateral tibial plateau fracture with hemarthrosis.   Original Report Authenticated By: Russell Sharp, M.D.   Dg C-arm [phone] Min  03/14/2013   *RADIOLOGY REPORT*  Clinical Data: Tibial plateau fracture.  DG C-ARM 61-120 MIN,LEFT KNEE - 1-2 VIEW  Comparison: Radiographs dated 03/10/2013  Findings: AP and lateral C-arm images demonstrate the patient has undergone open reduction and internal fixation of the depressed fracture of the lateral tibial plateau.  Side plate and multiple screws have been inserted.  The alignment and position of the fracture fragments appears anatomic.  IMPRESSION: Open reduction and internal fixation of left lateral tibial plateau fracture.   Original Report Authenticated By: Russell Sharp, M.D.    Disposition: 01-Home or Self Care      Discharge Orders   Future Orders Complete By Expires   Call MD / Call 911  As directed    Comments:     If you experience chest pain or shortness of breath, CALL 911 and be transported to the hospital emergency room.  If you develope a fever above 101 F, pus (white drainage) or increased drainage or redness at the wound, or calf pain, call your surgeon's office.   Constipation Prevention  As directed    Comments:     Drink plenty of fluids.  Prune juice may be helpful.  You may use a stool softener, such as Colace (over the counter) 100 mg twice a day.  Use MiraLax (over the counter) for constipation as needed.   Diet - low sodium heart healthy  As directed    Increase activity slowly as tolerated  As directed          Signed: [name] Sharp 03/19/2013, 9:52 AM

## 2013-04-18 ENCOUNTER — Telehealth: Payer: Self-pay | Admitting: Internal Medicine

## 2013-04-18 NOTE — Telephone Encounter (Signed)
Pt states he broke the bone in his knee and had to have a rod put in, he was going to [facility] with medical mutual insurance but that has run out and pt now has medicad and has to have a referral to go there. Sent referral over so pt can get is PT from there

## 2013-04-18 NOTE — Telephone Encounter (Signed)
OV pls, then we can discuss, refer

## 2013-04-19 NOTE — Telephone Encounter (Signed)
Pt coming in tomorrow

## 2013-04-20 ENCOUNTER — Encounter: Payer: Self-pay | Admitting: Medical

## 2013-04-20 ENCOUNTER — Ambulatory Visit (INDEPENDENT_AMBULATORY_CARE_PROVIDER_SITE_OTHER): Payer: PRIVATE HEALTH INSURANCE | Admitting: Medical

## 2013-04-20 VITALS — BP 130/80 | HR 92 | Temp 98.5°F | Resp 16 | Wt 151.0 lb

## 2013-04-20 DIAGNOSIS — Z23 Encounter for immunization: Secondary | ICD-10-CM

## 2013-04-20 DIAGNOSIS — S82142A Displaced bicondylar fracture of left tibia, initial encounter for closed fracture: Secondary | ICD-10-CM

## 2013-04-20 DIAGNOSIS — S82109A Unspecified fracture of upper end of unspecified tibia, initial encounter for closed fracture: Secondary | ICD-10-CM

## 2013-04-20 NOTE — Progress Notes (Signed)
Subjective: Here for referral.  He was seen in the emergency department back in August, fractured his left tibia after jumping over a porch rail.  Ended up having ORIF of left tibia, still on crutches, has been back to ortho for surgery f/u   Goes back 04/26/13 to decide when to start physical therapy.  Overall doing fine.  No other c/o.  Due to insurance change he needs a referral sent from [place].   He is already being managed by [facility] orthopedics.  Objective: Gen: wd, wn, nad, ambulating with crutches Not examined otherwise  Assessment: Encounter Diagnoses  Name Primary?  . Closed fracture of left tibial plateau Yes  . Need for prophylactic vaccination and inoculation against influenza    Plan: We will send a referral to [facility] orthopedics who is already managing his fracture Counseled on the influenza virus vaccine.  Vaccine information sheet given.  Influenza vaccine given after consent obtained.

## 2013-04-20 NOTE — Addendum Note (Signed)
Addended by: Janeice Robinson on: 04/20/2013 02:18 PM   Modules accepted: Orders

## 2013-04-30 ENCOUNTER — Ambulatory Visit: Payer: Medicaid Other | Attending: Orthopedic Surgery

## 2013-04-30 DIAGNOSIS — IMO0001 Reserved for inherently not codable concepts without codable children: Secondary | ICD-10-CM | POA: Insufficient documentation

## 2013-04-30 DIAGNOSIS — R262 Difficulty in walking, not elsewhere classified: Secondary | ICD-10-CM | POA: Insufficient documentation

## 2013-04-30 DIAGNOSIS — M25569 Pain in unspecified knee: Secondary | ICD-10-CM | POA: Insufficient documentation

## 2013-04-30 DIAGNOSIS — R609 Edema, unspecified: Secondary | ICD-10-CM | POA: Insufficient documentation

## 2013-04-30 DIAGNOSIS — S8290XD Unspecified fracture of unspecified lower leg, subsequent encounter for closed fracture with routine healing: Secondary | ICD-10-CM | POA: Insufficient documentation

## 2013-04-30 DIAGNOSIS — M25669 Stiffness of unspecified knee, not elsewhere classified: Secondary | ICD-10-CM | POA: Insufficient documentation

## 2013-05-15 ENCOUNTER — Telehealth: Payer: Self-pay | Admitting: Medical

## 2013-05-15 NOTE — Telephone Encounter (Signed)
I am working on this. CLS 

## 2013-05-15 NOTE — Telephone Encounter (Signed)
pls refer 

## 2014-11-19 ENCOUNTER — Encounter: Payer: PRIVATE HEALTH INSURANCE | Admitting: Medical

## 2014-12-18 ENCOUNTER — Ambulatory Visit (INDEPENDENT_AMBULATORY_CARE_PROVIDER_SITE_OTHER): Payer: PRIVATE HEALTH INSURANCE | Admitting: Medical

## 2014-12-18 ENCOUNTER — Encounter: Payer: Self-pay | Admitting: Medical

## 2014-12-18 ENCOUNTER — Telehealth: Payer: Self-pay | Admitting: Medical

## 2014-12-18 VITALS — BP 110/80 | HR 82 | Temp 98.3°F | Resp 15 | Ht 66.5 in | Wt 150.0 lb

## 2014-12-18 DIAGNOSIS — Z125 Encounter for screening for malignant neoplasm of prostate: Secondary | ICD-10-CM | POA: Diagnosis not present

## 2014-12-18 DIAGNOSIS — Z87891 Personal history of nicotine dependence: Secondary | ICD-10-CM | POA: Diagnosis not present

## 2014-12-18 DIAGNOSIS — Z0000 Encounter for general adult medical examination without abnormal findings: Secondary | ICD-10-CM | POA: Diagnosis not present

## 2014-12-18 DIAGNOSIS — E291 Testicular hypofunction: Secondary | ICD-10-CM | POA: Diagnosis not present

## 2014-12-18 DIAGNOSIS — E7800 Pure hypercholesterolemia, unspecified: Secondary | ICD-10-CM | POA: Insufficient documentation

## 2014-12-18 DIAGNOSIS — Z9119 Patient's noncompliance with other medical treatment and regimen: Secondary | ICD-10-CM | POA: Diagnosis not present

## 2014-12-18 DIAGNOSIS — Z91199 Patient's noncompliance with other medical treatment and regimen due to unspecified reason: Secondary | ICD-10-CM

## 2014-12-18 DIAGNOSIS — Z1211 Encounter for screening for malignant neoplasm of colon: Secondary | ICD-10-CM | POA: Diagnosis not present

## 2014-12-18 LAB — CBC
HCT: 45.8 % (ref 39.0–52.0)
Hemoglobin: 15.3 g/dL (ref 13.0–17.0)
MCH: 29.4 pg (ref 26.0–34.0)
MCHC: 33.4 g/dL (ref 30.0–36.0)
MCV: 88.1 fL (ref 78.0–100.0)
MPV: 9.4 fL (ref 8.6–12.4)
PLATELETS: 188 10*3/uL (ref 150–400)
RBC: 5.2 MIL/uL (ref 4.22–5.81)
RDW: 14.3 % (ref 11.5–15.5)
WBC: 4.4 10*3/uL (ref 4.0–10.5)

## 2014-12-18 LAB — POCT URINALYSIS DIPSTICK
BILIRUBIN UA: NEGATIVE
Blood, UA: NEGATIVE
GLUCOSE UA: NEGATIVE
KETONES UA: NEGATIVE
LEUKOCYTES UA: NEGATIVE
Nitrite, UA: NEGATIVE
PROTEIN UA: NEGATIVE
Spec Grav, UA: 1.025
Urobilinogen, UA: NEGATIVE
pH, UA: 6

## 2014-12-18 LAB — COMPREHENSIVE METABOLIC PANEL
ALT: 26 U/L (ref 0–53)
AST: 29 U/L (ref 0–37)
Albumin: 4.4 g/dL (ref 3.5–5.2)
Alkaline Phosphatase: 35 U/L — ABNORMAL LOW (ref 39–117)
BUN: 17 mg/dL (ref 6–23)
CHLORIDE: 101 meq/L (ref 96–112)
CO2: 25 mEq/L (ref 19–32)
CREATININE: 1.12 mg/dL (ref 0.50–1.35)
Calcium: 9.6 mg/dL (ref 8.4–10.5)
GLUCOSE: 82 mg/dL (ref 70–99)
POTASSIUM: 4.6 meq/L (ref 3.5–5.3)
Sodium: 136 mEq/L (ref 135–145)
Total Bilirubin: 0.9 mg/dL (ref 0.2–1.2)
Total Protein: 7.3 g/dL (ref 6.0–8.3)

## 2014-12-18 LAB — LIPID PANEL
CHOLESTEROL: 235 mg/dL — AB (ref 0–200)
HDL: 65 mg/dL (ref >=40)
LDL Cholesterol: 155 mg/dL — ABNORMAL HIGH (ref 0–99)
TRIGLYCERIDES: 76 mg/dL (ref <150)
Total CHOL/HDL Ratio: 3.6 Ratio
VLDL: 15 mg/dL (ref 0–40)

## 2014-12-18 NOTE — Telephone Encounter (Signed)
Refer for first screening colonoscopy 

## 2014-12-18 NOTE — Progress Notes (Signed)
Subjective:   HPI  Russell Sharp is a 54 y.o. male who presents for a complete physical.  Preventative care: Last ophthalmology visit: YES- LAST EYE EXAM 09/2014-  Last dental visit: YES- [place] SMILES Last colonoscopy: N/A Last prostate exam: ? Last EKG: 02/2013 Last labs:2013  Prior vaccinations: TD or Tdap:2012 Influenza: N/A Pneumococcal:N/A Shingles/Zostavax:N/A  Concerns: none  Reviewed their medical, surgical, family, social, medication, and allergy history and updated chart as appropriate.  Past Medical History  Diagnosis Date  . Pneumonia     hospitalized age 24  . Hypogonadism male     prior Androgel use x 29mo  . Chest pain 2008    EKG and stress testing  . Wears glasses     Past Surgical History  Procedure Laterality Date  . Orif tibia plateau Left 03/14/2013    Procedure: OPEN REDUCTION INTERNAL FIXATION (ORIF) LEFT TIBIAL PLATEAU FRACTURE ;  Surgeon: Verlee Rossetti, MD;  Location: [facility] OR;  Service: Orthopedics;  Laterality: Left;    History   Social History  . Marital Status: Married    Spouse Name: N/A  . Number of Children: N/A  . Years of Education: N/A   Occupational History  . 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 Smoking status: Former Smoker -- 0.25 packs/day for 3 years    Types: Cigars    Quit date: 02/24/2011  . Smokeless tobacco: Never Used  . Alcohol Use: No     Comment: occ. beer  . Drug Use: No  . Sexual Activity: Not on file   Other Topics Concern  . Not on file   Social History Narrative   Married, 3 children, ages 57yo son, 22yo daughter, 6yo daughter, exercise - walking.  Location manager.    Family History  Problem Relation Age of Onset  . Cancer Mother     died of lung cancer  . Hypertension Mother   . Other Father     died in the [employer]  . Diabetes Father   . Hyperlipidemia Brother   . Cancer Maternal Aunt     died of lung cancer  . Stroke Neg Hx   .  Heart disease Neg Hx      Current outpatient prescriptions:  .  aspirin EC 81 MG tablet, Take 1 tablet (81 mg total) by mouth daily., Disp: 30 tablet, Rfl: 0 .  acetaminophen-codeine 120-12 MG/5ML suspension, Take 5 mLs by mouth every 6 (six) hours as needed for pain., Disp: , Rfl:   No Known Allergies   Review of Systems Constitutional: -fever, -chills, -sweats, -unexpected weight change, -decreased appetite, -fatigue Allergy: -sneezing, -itching, -congestion Dermatology: -changing moles, --rash, -lumps ENT: -runny nose, -ear pain, -sore throat, -hoarseness, -sinus pain, -teeth pain, - ringing in ears, -hearing loss, -nosebleeds Cardiology: -chest pain, -palpitations, -swelling, -difficulty breathing when lying flat, -waking up short of breath Respiratory: -cough, -shortness of breath, -difficulty breathing with exercise or exertion, -wheezing, -coughing up blood Gastroenterology: -abdominal pain, -nausea, -vomiting, -diarrhea, -constipation, -blood in stool, -changes in bowel movement, -difficulty swallowing or eating Hematology: -bleeding, -bruising  Musculoskeletal: -joint aches, -muscle aches, -joint swelling, -back pain, -neck pain, -cramping, -changes in gait Ophthalmology: denies vision changes, eye redness, itching, discharge Urology: -burning with urination, -difficulty urinating, -blood in urine, -urinary frequency, -urgency, -incontinence Neurology: -headache, -weakness, -tingling, -numbness, -memory loss, -falls, -dizziness Psychology: -depressed mood, -agitation, -sleep problems     Objective:   Physical Exam  BP 110/80 mmHg  Pulse 82  Temp(Src) 98.3 F (36.8 C) (Oral)  Resp 15  Ht 5' 6.5" (1.689 m)  Wt 150 lb (68.04 kg)  BMI 23.85 kg/m2  General appearance: alert, no distress, WD/WN, lean black male Skin: left neck with small pedunculated skin tag, few other small Cawley flat macules, benign appearing HEENT: normocephalic, conjunctiva/corneas normal, sclerae  anicteric, PERRLA, EOMi, nares patent, no discharge or erythema, pharynx normal Oral cavity: MMM, tongue normal, teeth with several teeth in obvious decay bilat upper and lower Neck: supple, no lymphadenopathy, no thyromegaly, no masses, normal ROM, no bruits Chest: non tender, normal shape and expansion Heart: RRR, normal S1, S2, no murmurs Lungs: clear, no wheezes, no rhonchi, no rales Abdomen: +bs, soft, non tender, non distended, no masses, no hepatomegaly, no splenomegaly, no bruits Back: non tender, normal ROM, no scoliosis Musculoskeletal: upper extremities non tender, no obvious deformity, normal ROM throughout, lower extremities non tender, no obvious deformity, normal ROM throughout Extremities: no edema, no cyanosis, no clubbing Pulses: 2+ symmetric, upper and lower extremities, normal cap refill Neurological: alert, oriented x 3, CN2-12 intact, strength normal upper extremities and lower extremities, sensation normal throughout, DTRs 2+ throughout, no cerebellar signs, gait normal Psychiatric: normal affect, behavior normal, pleasant  GU: normal male external genitalia, uncircumcised, nontender, no masses, no hernia, no lymphadenopathy Rectal: anus normal appearing, prostate WNL, no nodules or tenderness, no obvious hemorrhoids   Assessment and Plan :    Encounter Diagnoses  Name Primary?  . Encounter for health maintenance examination in adult Yes  . Former smoker   . Pure hypercholesterolemia   . Special screening for malignant neoplasms, colon   . Screening for prostate cancer   . Hypogonadism in male   . Noncompliance    Physical exam - discussed healthy lifestyle, diet, exercise, preventative care, vaccinations, and addressed their concerns.   See your eye doctor yearly for routine vision care. See your dentist yearly for routine dental care including hygiene visits twice yearly. Referral for first colonoscopy Will likely begin statin, c/t aspirin  daily Noncompliance related to not starting statin from last physical.  He did make diet and exercise changes since last physical that he has continued Follow-up pending labs, referral

## 2014-12-19 ENCOUNTER — Other Ambulatory Visit: Payer: Self-pay | Admitting: Medical

## 2014-12-19 LAB — PSA: PSA: 0.56 ng/mL (ref <=4.00)

## 2014-12-19 MED ORDER — ROSUVASTATIN CALCIUM 20 MG PO TABS: 20.0000 mg | ORAL_TABLET | Freq: Every day | ORAL | Status: DC

## 2014-12-19 NOTE — Progress Notes (Signed)
LM to CB

## 2014-12-19 NOTE — Telephone Encounter (Signed)
Put in referral for [facility] to pick up and call pt to schedule appt

## 2014-12-20 ENCOUNTER — Encounter: Payer: Self-pay | Admitting: Internal Medicine

## 2015-03-11 ENCOUNTER — Encounter: Payer: PRIVATE HEALTH INSURANCE | Admitting: Internal Medicine

## 2015-04-26 HISTORY — PX: COLONOSCOPY: SHX174

## 2015-04-28 ENCOUNTER — Ambulatory Visit (AMBULATORY_SURGERY_CENTER): Payer: Self-pay | Admitting: *Deleted

## 2015-04-28 VITALS — Ht 66.0 in | Wt 150.4 lb

## 2015-04-28 DIAGNOSIS — Z1211 Encounter for screening for malignant neoplasm of colon: Secondary | ICD-10-CM

## 2015-04-28 NOTE — Progress Notes (Signed)
No egg or soy allergy No issues with past sedation No diet pills No home 02 use     

## 2015-05-07 ENCOUNTER — Ambulatory Visit (AMBULATORY_SURGERY_CENTER): Payer: PRIVATE HEALTH INSURANCE | Admitting: Internal Medicine

## 2015-05-07 ENCOUNTER — Encounter: Payer: Self-pay | Admitting: Internal Medicine

## 2015-05-07 VITALS — BP 152/110 | HR 79 | Temp 95.7°F | Resp 27 | Ht 66.0 in | Wt 150.0 lb

## 2015-05-07 DIAGNOSIS — Z1211 Encounter for screening for malignant neoplasm of colon: Secondary | ICD-10-CM

## 2015-05-07 MED ORDER — SODIUM CHLORIDE 0.9 % IV SOLN: 500.0000 mL | INTRAVENOUS | Status: DC

## 2015-05-07 NOTE — Progress Notes (Signed)
A/ox3 pleased with MAC, report to Penny RN 

## 2015-05-07 NOTE — Patient Instructions (Addendum)
 The colonoscopy was normal.  Next routine colonoscopy in 10 years - 2026  I appreciate the opportunity to care for you. Carl E. Gessner, MD, FACG   YOU HAD AN ENDOSCOPIC PROCEDURE TODAY AT THE [facility]:   Refer to the procedure report that was given to you for any specific questions about what was found during the examination.  If the procedure report does not answer your questions, please call your gastroenterologist to clarify.  If you requested that your care partner not be given the details of your procedure findings, then the procedure report has been included in a sealed envelope for you to review at your convenience later.  YOU SHOULD EXPECT: Some feelings of bloating in the abdomen. Passage of more gas than usual.  Walking can help get rid of the air that was put into your GI tract during the procedure and reduce the bloating. If you had a lower endoscopy (such as a colonoscopy or flexible sigmoidoscopy) you may notice spotting of blood in your stool or on the toilet paper. If you underwent a bowel prep for your procedure, you may not have a normal bowel movement for a few days.  Please Note:  You might notice some irritation and congestion in your nose or some drainage.  This is from the oxygen used during your procedure.  There is no need for concern and it should clear up in a day or so.  SYMPTOMS TO REPORT IMMEDIATELY:   Following lower endoscopy (colonoscopy or flexible sigmoidoscopy):  Excessive amounts of blood in the stool  Significant tenderness or worsening of abdominal pains  Swelling of the abdomen that is new, acute  Fever of 100F or higher   For urgent or emergent issues, a gastroenterologist can be reached at any hour by calling [phone].   DIET: Your first meal following the procedure should be a small meal and then it is ok to progress to your normal diet. Heavy or fried foods are harder to digest and may make you feel nauseous or  bloated.  Likewise, meals heavy in dairy and vegetables can increase bloating.  Drink plenty of fluids but you should avoid alcoholic beverages for 24 hours.  ACTIVITY:  You should plan to take it easy for the rest of today and you should NOT DRIVE or use heavy machinery until tomorrow (because of the sedation medicines used during the test).    FOLLOW UP: Our staff will call the number listed on your records the next business day following your procedure to check on you and address any questions or concerns that you may have regarding the information given to you following your procedure. If we do not reach you, we will leave a message.  However, if you are feeling well and you are not experiencing any problems, there is no need to return our call.  We will assume that you have returned to your regular daily activities without incident.  If any biopsies were taken you will be contacted by phone or by letter within the next 1-3 weeks.  Please call us at [phone] if you have not heard about the biopsies in 3 weeks.    SIGNATURES/CONFIDENTIALITY: You and/or your care partner have signed paperwork which will be entered into your electronic medical record.  These signatures attest to the fact that that the information above on your After Visit Summary has been reviewed and is understood.  Full responsibility of the confidentiality of this discharge information lies   with you and/or your care-partner. 

## 2015-05-07 NOTE — Op Note (Signed)
[facility] [address] [place], [id number]   COLONOSCOPY PROCEDURE REPORT  PATIENT: Russell Sharp, Russell  MR#: [id number] BIRTHDATE: 1961-04-22 , 53  yrs. old GENDER: male ENDOSCOPIST: Iva Booparl E Gessner, MD, [facility] PROCEDURE DATE:  05/07/2015 PROCEDURE:   Colonoscopy, screening First Screening Colonoscopy - Avg.  risk and is 50 yrs.  old or older Yes.  Prior Negative Screening - Now for repeat screening. N/A  History of Adenoma - Now for follow-up colonoscopy & has been > or = to 3 yrs.  N/A  Polyps removed today? No Recommend repeat exam, <10 yrs? No ASA CLASS:   Class II INDICATIONS:Screening for colonic neoplasia and Colorectal Neoplasm Risk Assessment for this procedure is average risk. MEDICATIONS: Propofol 200 mg IV and Monitored anesthesia care  DESCRIPTION OF PROCEDURE:   After the risks benefits and alternatives of the procedure were thoroughly explained, informed consent was obtained.  The digital rectal exam revealed no abnormalities of the rectum, revealed no prostatic nodules, and revealed the prostate was not enlarged.   The LB [id number] endoscope was introduced through the anus and advanced to the cecum, which was identified by both the appendix and ileocecal valve. No adverse events experienced.   The quality of the prep was good.  (MiraLax was used)  The instrument was then slowly withdrawn as the colon was fully examined. Estimated blood loss is zero unless otherwise noted in this procedure report.      COLON FINDINGS: A normal appearing cecum, ileocecal valve, and appendiceal orifice were identified.  The ascending, transverse, descending, sigmoid colon, and rectum appeared unremarkable. Retroflexed views revealed no abnormalities. The time to cecum = 2.2 Withdrawal time = 9.4   The scope was withdrawn and the procedure completed. COMPLICATIONS: There were no immediate complications.  ENDOSCOPIC IMPRESSION: Normal colonoscopy - good  prep  RECOMMENDATIONS: Repeat colonoscopy 10 years.  eSigned:  Iva Booparl E Gessner, MD, [facility] 05/07/2015 2:01 PM   cc: Sharlot GowdaJohn LaLonde, MD and The Patient

## 2015-05-08 ENCOUNTER — Telehealth: Payer: Self-pay | Admitting: *Deleted

## 2015-05-08 NOTE — Telephone Encounter (Signed)
  Follow up Call-  Call back number 05/07/2015  Post procedure Call Back phone  # [phone]  Permission to leave phone message Yes     Patient questions:  Do you have a fever, pain , or abdominal swelling? No. Pain Score  0 *  Have you tolerated food without any problems? Yes.    Have you been able to return to your normal activities? Yes.    Do you have any questions about your discharge instructions: Diet   No. Medications  No. Follow up visit  No.  Do you have questions or concerns about your Care? No.  Actions: * If pain score is 4 or above: No action needed, pain <4.

## 2015-07-07 ENCOUNTER — Ambulatory Visit: Payer: PRIVATE HEALTH INSURANCE | Admitting: Medical

## 2017-01-12 ENCOUNTER — Encounter: Payer: Self-pay | Admitting: Medical

## 2017-01-12 ENCOUNTER — Telehealth: Payer: Self-pay

## 2017-01-12 ENCOUNTER — Ambulatory Visit (INDEPENDENT_AMBULATORY_CARE_PROVIDER_SITE_OTHER): Payer: 59 | Admitting: Medical

## 2017-01-12 VITALS — BP 136/80 | HR 77 | Ht 65.0 in | Wt 145.6 lb

## 2017-01-12 DIAGNOSIS — E7800 Pure hypercholesterolemia, unspecified: Secondary | ICD-10-CM

## 2017-01-12 DIAGNOSIS — E291 Testicular hypofunction: Secondary | ICD-10-CM

## 2017-01-12 DIAGNOSIS — Z0000 Encounter for general adult medical examination without abnormal findings: Secondary | ICD-10-CM

## 2017-01-12 DIAGNOSIS — Z1159 Encounter for screening for other viral diseases: Secondary | ICD-10-CM | POA: Insufficient documentation

## 2017-01-12 DIAGNOSIS — N529 Male erectile dysfunction, unspecified: Secondary | ICD-10-CM | POA: Insufficient documentation

## 2017-01-12 DIAGNOSIS — Z125 Encounter for screening for malignant neoplasm of prostate: Secondary | ICD-10-CM | POA: Insufficient documentation

## 2017-01-12 LAB — COMPREHENSIVE METABOLIC PANEL
ALBUMIN: 4.8 g/dL (ref 3.6–5.1)
ALK PHOS: 45 U/L (ref 40–115)
ALT: 24 U/L (ref 9–46)
AST: 26 U/L (ref 10–35)
BUN: 12 mg/dL (ref 7–25)
CHLORIDE: 105 mmol/L (ref 98–110)
CO2: 20 mmol/L (ref 20–31)
CREATININE: 1.19 mg/dL (ref 0.70–1.33)
Calcium: 9.5 mg/dL (ref 8.6–10.3)
Glucose, Bld: 83 mg/dL (ref 65–99)
POTASSIUM: 3.8 mmol/L (ref 3.5–5.3)
SODIUM: 139 mmol/L (ref 135–146)
TOTAL PROTEIN: 7.9 g/dL (ref 6.1–8.1)
Total Bilirubin: 1.6 mg/dL — ABNORMAL HIGH (ref 0.2–1.2)

## 2017-01-12 LAB — LIPID PANEL
CHOLESTEROL: 258 mg/dL — AB (ref <200)
HDL: 76 mg/dL (ref >40)
LDL Cholesterol: 161 mg/dL — ABNORMAL HIGH (ref <100)
TRIGLYCERIDES: 106 mg/dL (ref <150)
Total CHOL/HDL Ratio: 3.4 Ratio (ref <5.0)
VLDL: 21 mg/dL (ref <30)

## 2017-01-12 LAB — CBC
HCT: 46.7 % (ref 38.5–50.0)
Hemoglobin: 15.6 g/dL (ref 13.2–17.1)
MCH: 29.5 pg (ref 27.0–33.0)
MCHC: 33.4 g/dL (ref 32.0–36.0)
MCV: 88.3 fL (ref 80.0–100.0)
MPV: 9.4 fL (ref 7.5–12.5)
Platelets: 215 10*3/uL (ref 140–400)
RBC: 5.29 MIL/uL (ref 4.20–5.80)
RDW: 14 % (ref 11.0–15.0)
WBC: 4.3 10*3/uL (ref 4.0–10.5)

## 2017-01-12 LAB — HEPATITIS C ANTIBODY: HCV AB: NEGATIVE

## 2017-01-12 LAB — HIV ANTIBODY (ROUTINE TESTING W REFLEX): HIV: NONREACTIVE

## 2017-01-12 NOTE — Patient Instructions (Signed)
Recommendations; See your eye doctor yearly for routine vision care.  See your dentist yearly for routine dental care including hygiene visits twice yearly.  I recommend you have a shingles vaccine to help prevent shingles or herpes zoster outbreak.   Please call your insurer to inquire about coverage for the Shingrix vaccine given in 2 doses.   Some insurers cover this vaccine after age 54, some cover this after age 54.  If your insurer covers this, then call to schedule appointment to have this vaccine here.

## 2017-01-12 NOTE — Telephone Encounter (Signed)
Have him sign/print his portion, copy, and return to him
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
## 2017-01-12 NOTE — Telephone Encounter (Signed)
Pt called asking if his form will be completed for pick up tomorrow. Please advise. Trixie Rude/RLB

## 2017-01-13 ENCOUNTER — Other Ambulatory Visit: Payer: Self-pay | Admitting: Medical

## 2017-01-13 LAB — HEMOGLOBIN A1C
Hgb A1c MFr Bld: 4.5 % (ref <5.7)
Mean Plasma Glucose: 82 mg/dL

## 2017-01-13 LAB — TESTOSTERONE: TESTOSTERONE: 397 ng/dL (ref 250–827)

## 2017-01-13 LAB — PSA: PSA: 0.5 ng/mL (ref <=4.0)

## 2017-01-13 MED ORDER — ROSUVASTATIN CALCIUM 20 MG PO TABS: 20.0000 mg | ORAL_TABLET | Freq: Every day | ORAL | 0 refills | Status: DC

## 2017-01-13 MED ORDER — IMIPRAMINE HCL 10 MG PO TABS: 10.0000 mg | ORAL_TABLET | Freq: Every day | ORAL | 2 refills | Status: DC

## 2017-01-13 NOTE — Telephone Encounter (Signed)
LM for pt to come pick up letter, He needs to sign. Trixie Rude/RLB

## 2017-01-17 NOTE — Telephone Encounter (Signed)
Called and l/m for pt call us back.  

## 2017-01-17 NOTE — Telephone Encounter (Signed)
Pt was notified.  

## 2017-02-21 ENCOUNTER — Ambulatory Visit (INDEPENDENT_AMBULATORY_CARE_PROVIDER_SITE_OTHER): Payer: 59 | Admitting: Family Medicine

## 2017-02-21 ENCOUNTER — Encounter: Payer: Self-pay | Admitting: Family Medicine

## 2017-02-21 VITALS — BP 138/88 | HR 92 | Ht 65.0 in | Wt 149.4 lb

## 2017-02-21 DIAGNOSIS — M25512 Pain in left shoulder: Secondary | ICD-10-CM | POA: Diagnosis not present

## 2017-02-21 MED ORDER — NAPROXEN 500 MG PO TABS: 500.0000 mg | ORAL_TABLET | Freq: Two times a day (BID) | ORAL | 0 refills | Status: DC

## 2017-02-21 NOTE — Progress Notes (Signed)
Chief Complaint  Patient presents with  . Shoulder Pain    left shoulder pain x 3 weeks-did not injure himself, thinks it may be arthritis. Can only sleep in one particular position at night. Advil not helping.    3 weeks ago he started having left shoulder pain. He woke up with the pain, as well as pain in the left neck, "crook in the neck".  No known change in activity, injury or overuse the day prior to onset of pain.  Pain has persisted since then.  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  He has been taking advil (400mg  sporadically, maybe once daily, max 2x/day), stopped taking when it didn't seem to help.  He also tried Aleve, wasn't any more helpful.  He tried [employer], not helpful.  Pain at lateral shoulder when he tries to raise elbow.  Hurts to lay on the left shoulder.  He can only sleep on his back or right side.  Still has some soreness in the muscles at the neck, improved from the first day.  No radiation of pain into the arm, no numbness, tingling or weakness.  No pain with reaching overhead.  He continues to work--can tolerate the discomfort at work, pain is worse at night.  Work doesn't seem to aggravate it. Didn't work today--didn't sleep well over the weekend due to pain, called into to work, to get evaluated today.   PMH, PSH, SH reviewed  Outpatient Encounter Prescriptions as of 02/21/2017  Medication Sig  . aspirin EC 81 MG tablet Take 1 tablet (81 mg total) by mouth daily.  Marland Kitchen. imipramine (TOFRANIL) 10 MG tablet Take 1 tablet (10 mg total) by mouth at bedtime.  . rosuvastatin (CRESTOR) 20 MG tablet Take 1 tablet (20 mg total) by mouth daily.   No facility-administered encounter medications on file as of 02/21/2017.    (takes imipramine for sleep/anxiety)  No Known Allergies  ROS:  No fever, chills, URI symptoms, chest pain, shortness of breath, cough, bleeding, bruising, rash, numbness, tingling or weakness. See HPI   PHYSICAL  EXAM:  BP 138/88 (BP Location: Right Arm, Patient Position: Sitting, Cuff Size: Normal)   Pulse 92   Ht 5\' 5"  (1.651 m)   Wt 149 lb 6.4 oz (67.8 kg)   BMI 24.86 kg/m   Well appearing pleasant male in no distress Neck--spine nontender, FROM. Slight discomfort with neck extension posteriorly on the left.  Minimally tender over left trapezius at upper shoulder. FROM of shoulders bilaterally.  No impingement or pain with ROM. Slightly tender at left anterior shoulder Area of discomfort is upper deltoid region.  nontender to palpation or with deltoid strength testing, but with stretching he feels the discomfort.  ASSESSMENT/PLAN:  Acute pain of left shoulder - suspect mild deltoid tendonitis or sprain, mild. Treat with NSAID, stretch, heat. Consider PT vs sports med consult if getting worse. - Plan: naproxen (NAPROSYN) 500 MG tablet   You may have a bit of a strain of the deltoid muscle vs tendonitis. Do the stretches as shown. You can also try applying heat for 10-15 minutes 3 times daily. You can retry a topical medication such as Biofreeze. Take the anti-inflammatory twice daily with food until you feel 100% better.  You likely will need this at least 7-10 days, but you can use the full 15 day supply if needed. Return for re-evaluation if it doesn't resolve.  Take your prescribed anti-inflammatory medication with food; discontinue or cut back  the dose if you develop stomach pain/discomfort/side effects.  Do not take other over-the-counter pain medications such as ibuprofen, advil, motrin, aleve, naproxen, BC or Goody Powder at the same time.  Do not use longer than recommended.  It is okay to use acetaminophen (tylenol) along with this medication.

## 2017-02-21 NOTE — Patient Instructions (Signed)
You may have a bit of a strain of the deltoid muscle vs tendonitis. Do the stretches as shown. You can also try applying heat for 10-15 minutes 3 times daily. You can retry a topical medication such as Biofreeze. Take the anti-inflammatory twice daily with food until you feel 100% better.  You likely will need this at least 7-10 days, but you can use the full 15 day supply if needed. Return for re-evaluation if it doesn't resolve.  Take your prescribed anti-inflammatory medication with food; discontinue or cut back the dose if you develop stomach pain/discomfort/side effects.  Do not take other over-the-counter pain medications such as ibuprofen, advil, motrin, aleve, naproxen, BC or Goody Powder at the same time.  Do not use longer than recommended.  It is okay to use acetaminophen (tylenol) along with this medication.  Consider trying the exercises below. Hold all positions for 10-15 seconds, 10 repetitions, twice daily.   Shoulder Exercises Ask your health care provider which exercises are safe for you. Do exercises exactly as told by your health care provider and adjust them as directed. It is normal to feel mild stretching, pulling, tightness, or discomfort as you do these exercises, but you should stop right away if you feel sudden pain or your pain gets worse.Do not begin these exercises until told by your health care provider. RANGE OF MOTION EXERCISES These exercises warm up your muscles and joints and improve the movement and flexibility of your shoulder. These exercises also help to relieve pain, numbness, and tingling. These exercises involve stretching your injured shoulder directly. Exercise A: Pendulum  1. Stand near a wall or a surface that you can hold onto for balance. 2. Bend at the waist and let your left / right arm hang straight down. Use your other arm to support you. Keep your back straight and do not lock your knees. 3. Relax your left / right arm and shoulder muscles,  and move your hips and your trunk so your left / right arm swings freely. Your arm should swing because of the motion of your body, not because you are using your arm or shoulder muscles. 4. Keep moving your body so your arm swings in the following directions, as told by your health care provider: ? Side to side. ? Forward and backward. ? In clockwise and counterclockwise circles. 5. Continue each motion for __________ seconds, or for as long as told by your health care provider. 6. Slowly return to the starting position. Repeat __________ times. Complete this exercise __________ times a day. Exercise B:Flexion, Standing  1. Stand and hold a broomstick, a cane, or a similar object. Place your hands a little more than shoulder-width apart on the object. Your left / right hand should be palm-up, and your other hand should be palm-down. 2. Keep your elbow straight and keep your shoulder muscles relaxed. Push the stick down with your healthy arm to raise your left / right arm in front of your body, and then over your head until you feel a stretch in your shoulder. ? Avoid shrugging your shoulder while you raise your arm. Keep your shoulder blade tucked down toward the middle of your back. 3. Hold for __________ seconds. 4. Slowly return to the starting position. Repeat __________ times. Complete this exercise __________ times a day. Exercise C: Abduction, Standing 1. Stand and hold a broomstick, a cane, or a similar object. Place your hands a little more than shoulder-width apart on the object. Your left / right  hand should be palm-up, and your other hand should be palm-down. 2. While keeping your elbow straight and your shoulder muscles relaxed, push the stick across your body toward your left / right side. Raise your left / right arm to the side of your body and then over your head until you feel a stretch in your shoulder. ? Do not raise your arm above shoulder height, unless your health care  provider tells you to do that. ? Avoid shrugging your shoulder while you raise your arm. Keep your shoulder blade tucked down toward the middle of your back. 3. Hold for __________ seconds. 4. Slowly return to the starting position. Repeat __________ times. Complete this exercise __________ times a day. Exercise D:Internal Rotation  1. Place your left / right hand behind your back, palm-up. 2. Use your other hand to dangle an exercise band, a towel, or a similar object over your shoulder. Grasp the band with your left / right hand so you are holding onto both ends. 3. Gently pull up on the band until you feel a stretch in the front of your left / right shoulder. ? Avoid shrugging your shoulder while you raise your arm. Keep your shoulder blade tucked down toward the middle of your back. 4. Hold for __________ seconds. 5. Release the stretch by letting go of the band and lowering your hands. Repeat __________ times. Complete this exercise __________ times a day. STRETCHING EXERCISES These exercises warm up your muscles and joints and improve the movement and flexibility of your shoulder. These exercises also help to relieve pain, numbness, and tingling. These exercises are done using your healthy shoulder to help stretch the muscles of your injured shoulder. Exercise E: [employer] officer, political party (External Rotation and Abduction)  1. Stand in a doorway with one of your feet slightly in front of the other. This is called a staggered stance. If you cannot reach your forearms to the door frame, stand facing a corner of a room. 2. Choose one of the following positions as told by your health care provider: ? Place your hands and forearms on the door frame above your head. ? Place your hands and forearms on the door frame at the height of your head. ? Place your hands on the door frame at the height of your elbows. 3. Slowly move your weight onto your front foot until you feel a stretch across your chest and in  the front of your shoulders. Keep your head and chest upright and keep your abdominal muscles tight. 4. Hold for __________ seconds. 5. To release the stretch, shift your weight to your back foot. Repeat __________ times. Complete this stretch __________ times a day. Exercise F:Extension, Standing 1. Stand and hold a broomstick, a cane, or a similar object behind your back. ? Your hands should be a little wider than shoulder-width apart. ? Your palms should face away from your back. 2. Keeping your elbows straight and keeping your shoulder muscles relaxed, move the stick away from your body until you feel a stretch in your shoulder. ? Avoid shrugging your shoulders while you move the stick. Keep your shoulder blade tucked down toward the middle of your back. 3. Hold for __________ seconds. 4. Slowly return to the starting position. Repeat __________ times. Complete this exercise __________ times a day. STRENGTHENING EXERCISES These exercises build strength and endurance in your shoulder. Endurance is the ability to use your muscles for a long time, even after they get tired. Exercise G:External Rotation  1. Sit in a stable chair without armrests. 2. Secure an exercise band at elbow height on your left / right side. 3. Place a soft object, such as a folded towel or a small pillow, between your left / right upper arm and your body to move your elbow a few inches away (about 10 cm) from your side. 4. Hold the end of the band so it is tight and there is no slack. 5. Keeping your elbow pressed against the soft object, move your left / right forearm out, away from your abdomen. Keep your body steady so only your forearm moves. 6. Hold for __________ seconds. 7. Slowly return to the starting position. Repeat __________ times. Complete this exercise __________ times a day. Exercise H:Shoulder Abduction  1. Sit in a stable chair without armrests, or stand. 2. Hold a __________ weight in your  left / right hand, or hold an exercise band with both hands. 3. Start with your arms straight down and your left / right palm facing in, toward your body. 4. Slowly lift your left / right hand out to your side. Do not lift your hand above shoulder height unless your health care provider tells you that this is safe. ? Keep your arms straight. ? Avoid shrugging your shoulder while you do this movement. Keep your shoulder blade tucked down toward the middle of your back. 5. Hold for __________ seconds. 6. Slowly lower your arm, and return to the starting position. Repeat __________ times. Complete this exercise __________ times a day. Exercise I:Shoulder Extension 1. Sit in a stable chair without armrests, or stand. 2. Secure an exercise band to a stable object in front of you where it is at shoulder height. 3. Hold one end of the exercise band in each hand. Your palms should face each other. 4. Straighten your elbows and lift your hands up to shoulder height. 5. Step back, away from the secured end of the exercise band, until the band is tight and there is no slack. 6. Squeeze your shoulder blades together as you pull your hands down to the sides of your thighs. Stop when your hands are straight down by your sides. Do not let your hands go behind your body. 7. Hold for __________ seconds. 8. Slowly return to the starting position. Repeat __________ times. Complete this exercise __________ times a day. Exercise J:Standing Shoulder Row 1. Sit in a stable chair without armrests, or stand. 2. Secure an exercise band to a stable object in front of you so it is at waist height. 3. Hold one end of the exercise band in each hand. Your palms should be in a thumbs-up position. 4. Bend each of your elbows to an "L" shape (about 90 degrees) and keep your upper arms at your sides. 5. Step back until the band is tight and there is no slack. 6. Slowly pull your elbows back behind you. 7. Hold for  __________ seconds. 8. Slowly return to the starting position. Repeat __________ times. Complete this exercise __________ times a day. Exercise K:Shoulder Press-Ups  1. Sit in a stable chair that has armrests. Sit upright, with your feet flat on the floor. 2. Put your hands on the armrests so your elbows are bent and your fingers are pointing forward. Your hands should be about even with the sides of your body. 3. Push down on the armrests and use your arms to lift yourself off of the chair. Straighten your elbows and lift yourself up as much  as you comfortably can. ? Move your shoulder blades down, and avoid letting your shoulders move up toward your ears. ? Keep your feet on the ground. As you get stronger, your feet should support less of your body weight as you lift yourself up. 4. Hold for __________ seconds. 5. Slowly lower yourself back into the chair. Repeat __________ times. Complete this exercise __________ times a day. Exercise L: Wall Push-Ups  1. Stand so you are facing a stable wall. Your feet should be about one arm-length away from the wall. 2. Lean forward and place your palms on the wall at shoulder height. 3. Keep your feet flat on the floor as you bend your elbows and lean forward toward the wall. 4. Hold for __________ seconds. 5. Straighten your elbows to push yourself back to the starting position. Repeat __________ times. Complete this exercise __________ times a day. This information is not intended to replace advice given to you by your health care provider. Make sure you discuss any questions you have with your health care provider. Document Released: 05/26/2005 Document Revised: 04/05/2016 Document Reviewed: 03/23/2015 Elsevier Interactive Patient Education  2018 [employer].

## 2018-03-16 ENCOUNTER — Encounter: Payer: Self-pay | Admitting: Family Medicine

## 2018-03-16 ENCOUNTER — Ambulatory Visit: Payer: 59 | Admitting: Medical

## 2018-03-16 ENCOUNTER — Ambulatory Visit (INDEPENDENT_AMBULATORY_CARE_PROVIDER_SITE_OTHER): Payer: 59 | Admitting: Family Medicine

## 2018-03-16 ENCOUNTER — Ambulatory Visit
Admission: RE | Admit: 2018-03-16 | Discharge: 2018-03-16 | Disposition: A | Discharge status: Home / Self Care | Payer: 59 | Source: Ambulatory Visit | Attending: Family Medicine | Admitting: Family Medicine

## 2018-03-16 VITALS — BP 122/88 | HR 81 | Temp 98.6°F | Wt 151.2 lb

## 2018-03-16 DIAGNOSIS — M25461 Effusion, right knee: Secondary | ICD-10-CM

## 2018-03-16 MED ORDER — TRIAMCINOLONE ACETONIDE 40 MG/ML IJ SUSP
40.0000 mg | Freq: Once | INTRAMUSCULAR | Status: AC
  Administered 2018-03-16: 40 mg via INTRAMUSCULAR

## 2018-03-16 MED ORDER — LIDOCAINE HCL (PF) 1 % IJ SOLN: 3.0000 mL | Freq: Once | INTRAMUSCULAR | Status: DC

## 2018-03-16 MED ORDER — LIDOCAINE HCL 1 % IJ SOLN
10.0000 mL | Freq: Once | INTRAMUSCULAR | Status: AC
  Administered 2018-03-16: 10 mL via INTRADERMAL

## 2018-03-16 NOTE — Patient Instructions (Signed)
Take 2 Aleve twice per day for right

## 2018-03-16 NOTE — Progress Notes (Signed)
   Subjective:    Patient ID: Russell Sharp, male    DOB: June 11, 1961, 57 y.o.   MRN: [id number]  HPI He states that for over a month he has had difficulty with right knee discomfort as well as swelling.  The swelling is gotten worse in the last day.  Has had no popping, locking or grinding but no other joints are involved.  No fever chills, skin or hair changes.   Review of Systems     Objective:   Physical Exam Alert and in no distress.  No swelling noted in his wrists or elbows or ankles.  Moderate effusion noted in the right knee.  No erythema is noted.  Is nontender to palpation.  Anterior drawer negative.  Medial and lateral collateral ligaments intact.  McMurray's testing negative. X-ray showed no changes.     Assessment & Plan:  Effusion of right knee - Plan: DG Knee Complete 4 Views Right, Cell Ct, Synovial w/o Crystals, triamcinolone acetonide (KENALOG-40) injection 40 mg, lidocaine (XYLOCAINE) 1 % (with pres) injection 10 mL, DISCONTINUED: lidocaine (PF) (XYLOCAINE) 1 % injection 3 mL I discussed the symptoms that he is having and possible options.  He would like to go ahead and have fluid drained off.  The knee was prepped laterally with Betadine.  20 cc of clear yellow fluid was removed without difficulty.  He was then given Kenalog and Xylocaine.  I will wait for the studies come back and then discuss it further with him.

## 2018-03-17 ENCOUNTER — Encounter: Payer: Self-pay | Admitting: Family Medicine

## 2018-03-17 ENCOUNTER — Telehealth: Payer: Self-pay | Admitting: Family Medicine

## 2018-03-17 ENCOUNTER — Ambulatory Visit (INDEPENDENT_AMBULATORY_CARE_PROVIDER_SITE_OTHER): Payer: 59 | Admitting: Family Medicine

## 2018-03-17 VITALS — BP 146/96 | HR 88 | Temp 98.0°F | Wt 154.6 lb

## 2018-03-17 DIAGNOSIS — M25461 Effusion, right knee: Secondary | ICD-10-CM

## 2018-03-17 LAB — CELL CT, SYNOVIAL W/O CRYSTALS
Eos, Fluid: 0 %
LYMPHS FL: 37 %
Lining Cells, Synovial: 0 %
Macrophages Fld: 60 %
NUC CELL # FLD: 220 {cells}/uL — AB (ref 0–200)
Polys, Fluid: 3 %

## 2018-03-17 MED ORDER — DICLOFENAC SODIUM 75 MG PO TBEC: 75.0000 mg | DELAYED_RELEASE_TABLET | Freq: Two times a day (BID) | ORAL | 0 refills | Status: DC

## 2018-03-17 NOTE — Telephone Encounter (Signed)
Pt stopped by office and states that went he was leaving the office yesterday after his appt, and his knee gave out and he fell in the parking lot and re-injured his knee.  I spoke with Dr. Susann GivensLalonde and he wanted to see him while he was here in the office.  Also note was given per Dr. Susann GivensLalonde to be out of work today.

## 2018-03-17 NOTE — Progress Notes (Signed)
   Subjective:    Patient ID: Russell Sharp, male    DOB: 08-13-1960, 57 y.o.   MRN: [id number]  HPI He is here for a recheck.  He was seen yesterday and sent for an x-ray.  In the office he did have an effusion that was evacuated of 20 cc of fluid.  He was given Xylocaine and Kenalog.  He did state that within 5 minutes he was pain-free.  He then went to get an x-ray and when he left the x-ray facility he apparently fell saying his knee gave out and landed on the anterior knee.  He did cause some slight discomfort however later that day he needed to take Aleve for it this morning he stated he could not go to work due to pain but states that now he had much less discomfort.   Review of Systems     Objective:   Physical Exam Alert and in no distress.  Exam of the right knee shows no evidence of trauma.  He is nontender over the anterior knee.  Patellar tendon and patella are normal.  Anterior drawer negative.  Other ligaments intact.  Negative McMurray's.  Slight effusion is present       Assessment & Plan:  Effusion of right knee I did call in Voltaren for him.  I explained that I thought he could go to work.  Crystal evaluation is not done otherwise the fluid was negative.

## 2018-03-17 NOTE — Telephone Encounter (Signed)
Per Dr. Susann GivensLalonde when talking with the patient he advised that he fell leaving the X-ray parking lot he fell not at this office.

## 2018-03-17 NOTE — Telephone Encounter (Signed)
Let him know that I called a different pain medication in.  Also make sure we get a crystal evaluation on his joint fluid.  I gave him Xylocaine so when he left he should not of been in any pain.  We will more information on his pain.

## 2018-03-17 NOTE — Telephone Encounter (Signed)
Pt is being seen today. KH

## 2018-03-17 NOTE — Telephone Encounter (Signed)
Pt left message on my phone after I had left for the day yesterday that his knee was hurting and Aleve wasn't working and he had to work today for 9 hours and needed something else for pain called into pharmacy.   I called pt and left message

## 2018-03-21 LAB — SYNOVIAL FLUID, CELL COUNT
Eos, Fluid: 0 %
LINING CELLS, SYNOVIAL: 0 %
LYMPHS FL: 55 %
Macrophages Fld: 43 %
Nuc cell # Fld: 519 cells/uL — ABNORMAL HIGH (ref 0–200)
POLYS FL: 2 %

## 2018-03-21 LAB — SPECIMEN STATUS REPORT

## 2018-04-12 ENCOUNTER — Other Ambulatory Visit: Payer: Self-pay | Admitting: Medical

## 2018-04-13 NOTE — Telephone Encounter (Signed)
Is this ok to refill?  

## 2018-04-13 NOTE — Telephone Encounter (Signed)
Send in 30 day supply after he schedules for physical.  Last CPX 12/2016

## 2018-04-13 NOTE — Telephone Encounter (Signed)
Left message on voicemail for patient to call back to schedule CPE appt.   

## 2018-12-01 ENCOUNTER — Encounter: Payer: Self-pay | Admitting: Medical

## 2018-12-01 ENCOUNTER — Ambulatory Visit: Payer: 59 | Admitting: Medical

## 2018-12-01 ENCOUNTER — Other Ambulatory Visit: Payer: Self-pay

## 2018-12-01 VITALS — Temp 98.5°F | Ht 66.0 in | Wt 145.0 lb

## 2018-12-01 DIAGNOSIS — H1032 Unspecified acute conjunctivitis, left eye: Secondary | ICD-10-CM

## 2018-12-01 MED ORDER — POLYMYXIN B-TRIMETHOPRIM 10000-0.1 UNIT/ML-% OP SOLN: 1.0000 [drp] | OPHTHALMIC | 0 refills | Status: DC

## 2018-12-01 NOTE — Progress Notes (Signed)
  Subjective: Russell Sharp is a 58 y.o. male who presents for possible pink eye.  Patient presents for evaluation of discharge, erythema and itching in the left eye.  They have noticed the above symptoms for 1 week.  Denies chills, ear congestion, ear pain, fever, headache and myalgias.  Using OTC allergy eye drops x 1 week for symptoms without improvement.  No other aggravating or relieving factors.  No other c/o.  Past Medical History:  Diagnosis Date  . Allergy    seasonal  . Chest pain 2008   EKG and stress testing  . Hyperlipidemia   . Hypogonadism male    prior Androgel use x 47mo  . Pneumonia    hospitalized age 37  . Wears glasses     ROS as in subjective   Objective: Temp 98.5 F (36.9 C) (Oral)   Ht 5\' 6"  (1.676 m)   Wt 145 lb (65.8 kg)   BMI 23.40 kg/m   General appearance: alert, no distress Left eye: conjunctiva with mild erythema, watery discharge, no swelling, eyelid with mild crusting laterally Right eye: conjunctiva normal, no discharge, no swelling, eyelids normal PERRLA, EOMi, eyes otherwise unremarkable       Assessment  Encounter Diagnosis  Name Primary?  . Acute conjunctivitis of left eye, unspecified acute conjunctivitis type Yes      Plan: Discussed diagnosis of conjunctivitis/pink eye.  Advised that pink eye is very contagious and spreads by direct contact.  Discussed treatment including moist warm compresses, antibiotic drops, avoid rubbing eyes, do not wear contact lenses or makeup until infection is resolved.  Discussed prevention, hand washing, not rubbing eyes.    Patient was advised to call or return if worse or not improving in the next few days.    Patient voiced understanding of diagnosis, recommendations, and treatment plan.  Russell Sharp was seen today for pink eye.  Diagnoses and all orders for this visit:  Acute conjunctivitis of left eye, unspecified acute conjunctivitis type  Other orders -     trimethoprim-polymyxin b (POLYTRIM)  ophthalmic solution; Place 1 drop into the left eye every 4 (four) hours.

## 2018-12-01 NOTE — Progress Notes (Signed)
Note emailed to pt

## 2019-01-19 ENCOUNTER — Other Ambulatory Visit: Payer: Self-pay | Admitting: Medical

## 2019-01-22 ENCOUNTER — Other Ambulatory Visit: Payer: Self-pay

## 2019-01-22 ENCOUNTER — Telehealth: Payer: Self-pay | Admitting: Medical

## 2019-01-22 DIAGNOSIS — E7800 Pure hypercholesterolemia, unspecified: Secondary | ICD-10-CM

## 2019-01-22 MED ORDER — ROSUVASTATIN CALCIUM 20 MG PO TABS: ORAL_TABLET | ORAL | 0 refills | Status: DC

## 2019-01-22 NOTE — Telephone Encounter (Signed)
rx sent to pharmacy

## 2019-01-22 NOTE — Telephone Encounter (Signed)
Pt called and scheduled a cpe. Needs a refill on Cholesterol med.

## 2019-02-06 ENCOUNTER — Encounter: Payer: Self-pay | Admitting: Medical

## 2019-02-06 ENCOUNTER — Ambulatory Visit (INDEPENDENT_AMBULATORY_CARE_PROVIDER_SITE_OTHER): Payer: 59 | Admitting: Medical

## 2019-02-06 VITALS — BP 130/72 | HR 87 | Temp 98.7°F | Ht 66.0 in | Wt 150.8 lb

## 2019-02-06 DIAGNOSIS — M7071 Other bursitis of hip, right hip: Secondary | ICD-10-CM | POA: Insufficient documentation

## 2019-02-06 DIAGNOSIS — Z125 Encounter for screening for malignant neoplasm of prostate: Secondary | ICD-10-CM | POA: Diagnosis not present

## 2019-02-06 DIAGNOSIS — Z0000 Encounter for general adult medical examination without abnormal findings: Secondary | ICD-10-CM | POA: Diagnosis not present

## 2019-02-06 DIAGNOSIS — Z7189 Other specified counseling: Secondary | ICD-10-CM | POA: Insufficient documentation

## 2019-02-06 DIAGNOSIS — Z7185 Encounter for immunization safety counseling: Secondary | ICD-10-CM | POA: Insufficient documentation

## 2019-02-06 DIAGNOSIS — E785 Hyperlipidemia, unspecified: Secondary | ICD-10-CM | POA: Insufficient documentation

## 2019-02-06 NOTE — Progress Notes (Signed)
Subjective:   HPI  Russell Sharp is a 58 y.o. male who presents for Chief Complaint  Patient presents with  . Annual Exam    Patient Care Team: , Camelia Eng, PA-C as PCP - General (Family Medicine) Sees dentist Sees eye doctor  Concerns: Exercise  - Walking at park, with dog and daughter.   Eating habits including lots of baked foods, lots of vegetables, fruits, not a lot of fried foods.     Occasionally gets some pains in right hip.  He works 8 hour days on hard concrete, Glass blower/designer.   Did a lot dancing in younger years.    Reviewed their medical, surgical, family, social, medication, and allergy history and updated chart as appropriate.  Past Medical History:  Diagnosis Date  . Allergy    seasonal  . Chest pain 2008   EKG and stress testing  . Hyperlipidemia   . Hypogonadism male    prior Androgel use x 8mo . Pneumonia    hospitalized age 58 . Wears glasses     Past Surgical History:  Procedure Laterality Date  . COLONOSCOPY  04/2015   normal, repeat 2026; Dr. CSilvano Rusk . ORIF TIBIA PLATEAU Left 03/14/2013   Procedure: OPEN REDUCTION INTERNAL FIXATION (ORIF) LEFT TIBIAL PLATEAU FRACTURE ;  Surgeon: SAugustin Schooling MD;  [facility]  Service: Orthopedics;  Laterality: Left;    Social History   Socioeconomic History  . Marital status: Married    Spouse name: Not on file  . Number of children: Not on file  . Years of education: Not on file  . Highest education level: Not on file  Occupational History  . 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 Financial resource strain: Not on file  . Food insecurity    Worry: Not on file    Inability: Not on file  . Transportation needs    Medical: Not on file    Non-medical: Not on file  Tobacco Use  . Smoking status: Former Smoker    Packs/day: 0.25    Years: 3.00    Pack years: 0.75    Types: Cigars    Quit date:  02/24/2011    Years since quitting: 7.9  . Smokeless tobacco: Never Used  Substance and Sexual Activity  . Alcohol use: Yes    Alcohol/week: 1.0 standard drinks    Types: 1 Glasses of wine per week    Comment: occ. beer  . Drug use: No  . Sexual activity: Not on file  Lifestyle  . Physical activity    Days per week: Not on file    Minutes per session: Not on file  . Stress: Not on file  Relationships  . Social [place] phone: Not on file    Gets together: Not on file    Attends religious service: Not on file    Active member of club or organization: Not on file    Attends meetings of clubs or organizations: Not on file    Relationship status: Not on file  . Intimate partner violence    Fear of current or ex partner: Not on file    Emotionally abused: Not on file    Physically abused: Not on file    Forced sexual activity: Not on file  Other Topics Concern  . Not on file  Social History Narrative  Married, 3 children, 2 grown, and 86yo daughter, exercise - stays active, swims, walks at the park, lots of movement at work.  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 Cancer Mother        died of lung cancer  . Hypertension Mother   . Other Father        died in the [employer]  . Diabetes Father   . Hyperlipidemia Brother   . Cancer Maternal Aunt        died of lung cancer  . Stroke Neg Hx   . Heart disease Neg Hx   . Colon cancer Neg Hx   . Rectal cancer Neg Hx   . Stomach cancer Neg Hx   . Colon polyps Neg Hx      Current Outpatient Medications:  .  aspirin EC 81 MG tablet, Take 1 tablet (81 mg total) by mouth daily., Disp: 30 tablet, Rfl: 0 .  rosuvastatin (CRESTOR) 20 MG tablet, TAKE 1 TABLET(20 MG) BY MOUTH DAILY, Disp: 90 tablet, Rfl: 0 .  diclofenac (VOLTAREN) 75 MG EC tablet, Take 1 tablet (75 mg total) by mouth 2 (two) times daily. (Patient not taking: Reported on 12/01/2018), Disp: 30 tablet,  Rfl: 0 .  ibuprofen (ADVIL,MOTRIN) 200 MG tablet, Take 200 mg by mouth every 6 (six) hours as needed., Disp: , Rfl:  .  imipramine (TOFRANIL) 10 MG tablet, TAKE 1 TABLET(10 MG) BY MOUTH AT BEDTIME (Patient not taking: Reported on 12/01/2018), Disp: 30 tablet, Rfl: 0 .  trimethoprim-polymyxin b (POLYTRIM) ophthalmic solution, Place 1 drop into the left eye every 4 (four) hours. (Patient not taking: Reported on 02/06/2019), Disp: 10 mL, Rfl: 0  No Known Allergies   Review of Systems Constitutional: -fever, -chills, -sweats, -unexpected weight change, -decreased appetite, -fatigue Allergy: -sneezing, -itching, -congestion Dermatology: -changing moles, --rash, -lumps ENT: -runny nose, -ear pain, -sore throat, -hoarseness, -sinus pain, -teeth pain, - ringing in ears, -hearing loss, -nosebleeds Cardiology: -chest pain, -palpitations, -swelling, -difficulty breathing when lying flat, -waking up short of breath Respiratory: -cough, -shortness of breath, -difficulty breathing with exercise or exertion, -wheezing, -coughing up blood Gastroenterology: -abdominal pain, -nausea, -vomiting, -diarrhea, -constipation, -blood in stool, -changes in bowel movement, -difficulty swallowing or eating Hematology: -bleeding, -bruising  Musculoskeletal: -joint aches, -muscle aches, -joint swelling, -back pain, -neck pain, -cramping, -changes in gait Ophthalmology: denies vision changes, eye redness, itching, discharge Urology: -burning with urination, -difficulty urinating, -blood in urine, -urinary frequency, -urgency, -incontinence Neurology: -headache, -weakness, -tingling, -numbness, -memory loss, -falls, -dizziness Psychology: -depressed mood, -agitation, -sleep problems Male GU: no testicular mass, pain, no lymph nodes swollen, no swelling, no rash.     Objective:  BP 130/72   Pulse 87   Temp 98.7 F (37.1 C) (Oral)   Ht '5\' 6"'  (1.676 m)   Wt 150 lb 12.8 oz (68.4 kg)   SpO2 97%   BMI 24.34 kg/m    General appearance: alert, no distress, WD/WN, African American male Skin: unremarkable HEENT: normocephalic, conjunctiva/corneas normal, sclerae anicteric, PERRLA, EOMi, nares patent, no discharge or erythema, pharynx normal Oral cavity: MMM, tongue normal, teeth normal Neck: supple, no lymphadenopathy, no thyromegaly, no masses, normal ROM, no bruits Chest: non tender, normal shape and expansion Heart: RRR, normal S1, S2, no murmurs Lungs: CTA bilaterally, no wheezes, rhonchi, or rales Abdomen: +bs, soft, non tender, non distended, no masses, no hepatomegaly, no splenomegaly, no bruits Back: non tender, normal ROM,  no scoliosis Musculoskeletal: right hip nontender, bilat hips with mildly decreased intneral ROM, surgical scar left lateral knee vertically, upper extremities non tender, no obvious deformity, normal ROM throughout, lower extremities non tender, no obvious deformity, normal ROM throughout Extremities: no edema, no cyanosis, no clubbing Pulses: 2+ symmetric, upper and lower extremities, normal cap refill Neurological: alert, oriented x 3, CN2-12 intact, strength normal upper extremities and lower extremities, sensation normal throughout, DTRs 2+ throughout, no cerebellar signs, gait normal Psychiatric: normal affect, behavior normal, pleasant  GU: normal male external genitalia, nontender, no masses, no hernia, no lymphadenopathy Rectal: anus normal tone, prostate moderately enlarged, no nodules    Assessment and Plan :   Encounter Diagnoses  Name Primary?  . Encounter for health maintenance examination in adult Yes  . Bursitis of right hip, unspecified bursa   . Screening for prostate cancer   . Vaccine counseling   . Hyperlipidemia, unspecified hyperlipidemia type     Physical exam - discussed and counseled on healthy lifestyle, diet, exercise, preventative care, vaccinations, sick and well care, proper use of emergency dept and after hours care, and addressed their  concerns.    Health screening: See your eye doctor yearly for routine vision care. See your dentist yearly for routine dental care including hygiene visits twice yearly.  Cancer screening Reviewed colonoscopy on file that is up to date Given stool cards kit to return for hemoccult screening  Discussed PSA, prostate exam, and prostate cancer screening risks/benefits.     Vaccinations: Advised yearly influenza vaccine Up to date on Td vaccine   Separate significant chronic issues discussed: Reviewed his 2016 colonoscopy which was normal.  Will send home with stool cards x3  PSA screening today  We discussed his right hip pain which sounds like bursitis.  Discussed acute therapy for this when it flares up  Hyperlipidemia-compliant with medication  Russell Sharp was seen today for annual exam.  Diagnoses and all orders for this visit:  Encounter for health maintenance examination in adult -     Comprehensive metabolic panel -     Lipid panel -     PSA -     CBC with Differential/Platelet  Bursitis of right hip, unspecified bursa  Screening for prostate cancer -     PSA  Vaccine counseling  Hyperlipidemia, unspecified hyperlipidemia type -     Lipid panel    Follow-up pending labs, yearly for physical

## 2019-02-06 NOTE — Patient Instructions (Signed)
Thanks for trusting us with your health care and for coming in for a physical today.  Below are some general recommendations I have for you:  Yearly screenings See your eye doctor yearly for routine vision care. See your dentist yearly for routine dental care including hygiene visits twice yearly. See me here yearly for a routine physical and preventative care visit   Please follow up yearly for a physical.   Preventative Care for Adults - Male      MAINTAIN REGULAR HEALTH EXAMS:  A routine yearly physical is a good way to check in with your primary care provider about your health and preventive screening. It is also an opportunity to share updates about your health and any concerns you have, and receive a thorough all-over exam.   Most health insurance companies pay for at least some preventative services.  Check with your health plan for specific coverages.  WHAT PREVENTATIVE SERVICES DO MEN NEED?  Adult men should have their weight and blood pressure checked regularly.   Men age 35 and older should have their cholesterol levels checked regularly.  Beginning at age 50 and continuing to age 75, men should be screened for colorectal cancer.  Certain people may need continued testing until age 85.  Updating vaccinations is part of preventative care.  Vaccinations help protect against diseases such as the flu.  Osteoporosis is a disease in which the bones lose minerals and strength as we age. Men ages 65 and over should discuss this with their caregivers  Lab tests are generally done as part of preventative care to screen for anemia and blood disorders, to screen for problems with the kidneys and liver, to screen for bladder problems, to check blood sugar, and to check your cholesterol level.  Preventative services generally include counseling about diet, exercise, avoiding tobacco, drugs, excessive alcohol consumption, and sexually transmitted infections.    GENERAL  RECOMMENDATIONS FOR GOOD HEALTH:  Healthy diet:  Eat a variety of foods, including fruit, vegetables, animal or vegetable protein, such as meat, fish, chicken, and eggs, or beans, lentils, tofu, and grains, such as rice.  Drink plenty of water daily.  Decrease saturated fat in the diet, avoid lots of red meat, processed foods, sweets, fast foods, and fried foods.  Exercise:  Aerobic exercise helps maintain good heart health. At least 30-40 minutes of moderate-intensity exercise is recommended. For example, a brisk walk that increases your heart rate and breathing. This should be done on most days of the week.   Find a type of exercise or a variety of exercises that you enjoy so that it becomes a part of your daily life.  Examples are running, walking, swimming, water aerobics, and biking.  For motivation and support, explore group exercise such as aerobic class, spin class, Zumba, Yoga,or  martial arts, etc.    Set exercise goals for yourself, such as a certain weight goal, walk or run in a race such as a 5k walk/run.  Speak to your primary care provider about exercise goals.  Disease prevention:  If you smoke or chew tobacco, find out from your caregiver how to quit. It can literally save your life, no matter how long you have been a tobacco user. If you do not use tobacco, never begin.   Maintain a healthy diet and normal weight. Increased weight leads to problems with blood pressure and diabetes.   The Body Mass Index or BMI is a way of measuring how much of your body is   fat. Having a BMI above 27 increases the risk of heart disease, diabetes, hypertension, stroke and other problems related to obesity. Your caregiver can help determine your BMI and based on it develop an exercise and dietary program to help you achieve or maintain this important measurement at a healthful level.  High blood pressure causes heart and blood vessel problems.  Persistent high blood pressure should be treated  with medicine if weight loss and exercise do not work.   Fat and cholesterol leaves deposits in your arteries that can block them. This causes heart disease and vessel disease elsewhere in your body.  If your cholesterol is found to be high, or if you have heart disease or certain other medical conditions, then you may need to have your cholesterol monitored frequently and be treated with medication.   Ask if you should have a cardiac stress test if your history suggests this. A stress test is a test done on a treadmill that looks for heart disease. This test can find disease prior to there being a problem.  Osteoporosis is a disease in which the bones lose minerals and strength as we age. This can result in serious bone fractures. Risk of osteoporosis can be identified using a bone density scan. Men ages 65 and over should discuss this with their caregivers. Ask your caregiver whether you should be taking a calcium supplement and Vitamin D, to reduce the rate of osteoporosis.   Avoid drinking alcohol in excess (more than two drinks per day).  Avoid use of street drugs. Do not share needles with anyone. Ask for professional help if you need assistance or instructions on stopping the use of alcohol, cigarettes, and/or drugs.  Brush your teeth twice a day with fluoride toothpaste, and floss once a day. Good oral hygiene prevents tooth decay and gum disease. The problems can be painful, unattractive, and can cause other health problems. Visit your dentist for a routine oral and dental check up and preventive care every 6-12 months.   Look at your skin regularly.  Use a mirror to look at your back. Notify your caregivers of changes in moles, especially if there are changes in shapes, colors, a size larger than a pencil eraser, an irregular border, or development of new moles.  Safety:  Use seatbelts 100% of the time, whether driving or as a passenger.  Use safety devices such as hearing protection if you  work in environments with loud noise or significant background noise.  Use safety glasses when doing any work that could send debris in to the eyes.  Use a helmet if you ride a bike or motorcycle.  Use appropriate safety gear for contact sports.  Talk to your caregiver about gun safety.  Use sunscreen with a SPF (or skin protection factor) of 15 or greater.  Lighter skinned people are at a greater risk of skin cancer. Don't forget to also wear sunglasses in order to protect your eyes from too much damaging sunlight. Damaging sunlight can accelerate cataract formation.   Practice safe sex. Use condoms. Condoms are used for birth control and to help reduce the spread of sexually transmitted infections (or STIs).  Some of the STIs are gonorrhea (the clap), chlamydia, syphilis, trichomonas, herpes, HPV (human papilloma virus) and HIV (human immunodeficiency virus) which causes AIDS. The herpes, HIV and HPV are viral illnesses that have no cure. These can result in disability, cancer and death.   Keep carbon monoxide and smoke detectors in your home functioning   at all times. Change the batteries every 6 months or use a model that plugs into the wall.   Vaccinations:  Stay up to date with your tetanus shots and other required immunizations. You should have a booster for tetanus every 10 years. Be sure to get your flu shot every year, since 5%-20% of the [place] population comes down with the flu. The flu vaccine changes each year, so being vaccinated once is not enough. Get your shot in the fall, before the flu season peaks.   Other vaccines to consider:  Human Papilloma Virus or HPV causes cancer of the cervix, and other infections that can be transmitted from person to person. There is a vaccine for HPV, and males should get immunized between the ages of 11 and 26. It requires a series of 3 shots.   Pneumococcal vaccine to protect against certain types of pneumonia.  This is normally recommended for adults  age 65 or older.  However, adults younger than 56 years old with certain underlying conditions such as diabetes, heart or lung disease should also receive the vaccine.  Shingles vaccine to protect against Varicella Zoster if you are older than age 60, or younger than 56 years old with certain underlying illness.  If you have not had the Shingrix vaccine, please call your insurer to inquire about coverage for the Shingrix vaccine given in 2 doses.   Some insurers cover this vaccine after age 50, some cover this after age 60.  If your insurer covers this, then call to schedule appointment to have this vaccine here  Hepatitis A vaccine to protect against a form of infection of the liver by a virus acquired from food.  Hepatitis B vaccine to protect against a form of infection of the liver by a virus acquired from blood or body fluids, particularly if you work in health care.  If you plan to travel internationally, check with your local health department for specific vaccination recommendations.   What should I know about cancer screening? Many types of cancers can be detected early and may often be prevented. Lung Cancer  You should be screened every year for lung cancer if: ? You are a current smoker who has smoked for at least 30 years. ? You are a former smoker who has quit within the past 15 years.  Talk to your health care provider about your screening options, when you should start screening, and how often you should be screened.  Colorectal Cancer  Routine colorectal cancer screening usually begins at 56 years of age and should be repeated every 5-10 years until you are 56 years old. You may need to be screened more often if early forms of precancerous polyps or small growths are found. Your health care provider may recommend screening at an earlier age if you have risk factors for colon cancer.  Your health care provider may recommend using home test kits to check for hidden blood in  the stool.  A small camera at the end of a tube can be used to examine your colon (sigmoidoscopy or colonoscopy). This checks for the earliest forms of colorectal cancer.  Prostate and Testicular Cancer  Depending on your age and overall health, your health care provider may do certain tests to screen for prostate and testicular cancer.  Talk to your health care provider about any symptoms or concerns you have about testicular or prostate cancer.  Skin Cancer  Check your skin from head to toe regularly.  Tell your   health care provider about any new moles or changes in moles, especially if: ? There is a change in a mole's size, shape, or color. ? You have a mole that is larger than a pencil eraser.  Always use sunscreen. Apply sunscreen liberally and repeat throughout the day.  Protect yourself by wearing long sleeves, pants, a wide-brimmed hat, and sunglasses when outside.   

## 2019-02-07 ENCOUNTER — Other Ambulatory Visit: Payer: Self-pay | Admitting: Medical

## 2019-02-07 DIAGNOSIS — E7800 Pure hypercholesterolemia, unspecified: Secondary | ICD-10-CM

## 2019-02-07 LAB — LIPID PANEL
Chol/HDL Ratio: 2.9 ratio (ref 0.0–5.0)
Cholesterol, Total: 214 mg/dL — ABNORMAL HIGH (ref 100–199)
HDL: 75 mg/dL (ref >39)
LDL Calculated: 118 mg/dL — ABNORMAL HIGH (ref 0–99)
Triglycerides: 103 mg/dL (ref 0–149)
VLDL Cholesterol Cal: 21 mg/dL (ref 5–40)

## 2019-02-07 LAB — CBC WITH DIFFERENTIAL/PLATELET
Basophils Absolute: 0 10*3/uL (ref 0.0–0.2)
Basos: 1 %
EOS (ABSOLUTE): 0 10*3/uL (ref 0.0–0.4)
Eos: 1 %
Hematocrit: 50.9 % (ref 37.5–51.0)
Hemoglobin: 16.7 g/dL (ref 13.0–17.7)
Immature Grans (Abs): 0 10*3/uL (ref 0.0–0.1)
Immature Granulocytes: 0 %
Lymphocytes Absolute: 2.2 10*3/uL (ref 0.7–3.1)
Lymphs: 43 %
MCH: 29.3 pg (ref 26.6–33.0)
MCHC: 32.8 g/dL (ref 31.5–35.7)
MCV: 90 fL (ref 79–97)
Monocytes Absolute: 0.5 10*3/uL (ref 0.1–0.9)
Monocytes: 9 %
Neutrophils Absolute: 2.4 10*3/uL (ref 1.4–7.0)
Neutrophils: 46 %
Platelets: 217 10*3/uL (ref 150–450)
RBC: 5.69 x10E6/uL (ref 4.14–5.80)
RDW: 13.5 % (ref 11.6–15.4)
WBC: 5.2 10*3/uL (ref 3.4–10.8)

## 2019-02-07 LAB — COMPREHENSIVE METABOLIC PANEL
ALT: 41 IU/L (ref 0–44)
AST: 36 IU/L (ref 0–40)
Albumin/Globulin Ratio: 1.6 (ref 1.2–2.2)
Albumin: 4.5 g/dL (ref 3.8–4.9)
Alkaline Phosphatase: 54 IU/L (ref 39–117)
BUN/Creatinine Ratio: 12 (ref 9–20)
BUN: 13 mg/dL (ref 6–24)
Bilirubin Total: 1.1 mg/dL (ref 0.0–1.2)
CO2: 22 mmol/L (ref 20–29)
Calcium: 9.5 mg/dL (ref 8.7–10.2)
Chloride: 102 mmol/L (ref 96–106)
Creatinine, Ser: 1.05 mg/dL (ref 0.76–1.27)
GFR calc Af Amer: 92 mL/min/{1.73_m2} (ref >59)
GFR calc non Af Amer: 80 mL/min/{1.73_m2} (ref >59)
Globulin, Total: 2.8 g/dL (ref 1.5–4.5)
Glucose: 86 mg/dL (ref 65–99)
Potassium: 4.3 mmol/L (ref 3.5–5.2)
Sodium: 138 mmol/L (ref 134–144)
Total Protein: 7.3 g/dL (ref 6.0–8.5)

## 2019-02-07 LAB — PSA: Prostate Specific Ag, Serum: 0.8 ng/mL (ref 0.0–4.0)

## 2019-02-07 MED ORDER — ROSUVASTATIN CALCIUM 20 MG PO TABS: ORAL_TABLET | ORAL | 3 refills | Status: DC

## 2019-02-07 MED ORDER — ASPIRIN EC 81 MG PO TBEC: 81.0000 mg | DELAYED_RELEASE_TABLET | Freq: Every day | ORAL | 3 refills | Status: DC

## 2019-02-26 ENCOUNTER — Other Ambulatory Visit: Payer: Self-pay

## 2019-02-26 DIAGNOSIS — Z20822 Contact with and (suspected) exposure to covid-19: Secondary | ICD-10-CM

## 2019-02-27 LAB — NOVEL CORONAVIRUS, NAA: SARS-CoV-2, NAA: NOT DETECTED

## 2019-03-05 ENCOUNTER — Telehealth: Payer: Self-pay | Admitting: Medical

## 2019-03-05 NOTE — Telephone Encounter (Signed)
Negative COVID results given. Patient results "NOT Detected." Caller expressed understanding. ° °

## 2019-03-07 ENCOUNTER — Telehealth: Payer: Self-pay | Admitting: Medical

## 2019-03-07 NOTE — Telephone Encounter (Signed)
Pt call to receive a back to work note. Pt was tested for COVID and results came back negative. He was tested at [facility]. He does not have any symptoms at this time. He is requesting a note to clear him to go back to work. 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 Please advise pt at [phone].

## 2019-03-07 NOTE — Telephone Encounter (Signed)
As long as he has been fever free for at least 3 days and no longer having symptoms, ok to give him a note clearing him for work.

## 2019-03-08 ENCOUNTER — Encounter: Payer: Self-pay | Admitting: Family Medicine

## 2019-03-08 NOTE — Telephone Encounter (Signed)
PATIENT called he has never had fever, his sx have completely resolved.  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  He did have runny nose.  He was Covid tested on 8/3 , he did receive a negative result on 8/4.  He needed letter to return to work.  Letter given for 8/14

## 2019-03-09 ENCOUNTER — Encounter: Payer: Self-pay | Admitting: Medical

## 2019-04-05 ENCOUNTER — Telehealth: Payer: Self-pay | Admitting: Medical

## 2019-04-05 NOTE — Telephone Encounter (Signed)
lmom asking patient to call back for a message from provider.

## 2019-04-05 NOTE — Telephone Encounter (Signed)
I received a message from their pharmacy advising that they are not taking the medication regularly for Rosuvastatin/Crestor cholesterol medication.   This is based on refill data.  Not taking her medication regularly could mean that the medicine is not working effectively or providing the proper treatment.   I just wanted to remind him to take the medication regularly for the proper therapeutic effect.

## 2019-11-08 ENCOUNTER — Ambulatory Visit: Payer: 59

## 2020-08-12 ENCOUNTER — Encounter: Payer: 59 | Admitting: Medical

## 2020-09-23 ENCOUNTER — Encounter: Payer: Self-pay | Admitting: Medical

## 2020-09-23 ENCOUNTER — Ambulatory Visit: Payer: No Typology Code available for payment source | Admitting: Medical

## 2020-09-23 ENCOUNTER — Other Ambulatory Visit: Payer: Self-pay

## 2020-09-23 VITALS — BP 140/90 | HR 90 | Ht 66.0 in | Wt 150.2 lb

## 2020-09-23 DIAGNOSIS — R030 Elevated blood-pressure reading, without diagnosis of hypertension: Secondary | ICD-10-CM | POA: Insufficient documentation

## 2020-09-23 DIAGNOSIS — E782 Mixed hyperlipidemia: Secondary | ICD-10-CM

## 2020-09-23 DIAGNOSIS — Z23 Encounter for immunization: Secondary | ICD-10-CM | POA: Diagnosis not present

## 2020-09-23 DIAGNOSIS — Z1211 Encounter for screening for malignant neoplasm of colon: Secondary | ICD-10-CM

## 2020-09-23 DIAGNOSIS — Z7185 Encounter for immunization safety counseling: Secondary | ICD-10-CM

## 2020-09-23 DIAGNOSIS — I10 Essential (primary) hypertension: Secondary | ICD-10-CM | POA: Insufficient documentation

## 2020-09-23 DIAGNOSIS — E7800 Pure hypercholesterolemia, unspecified: Secondary | ICD-10-CM

## 2020-09-23 DIAGNOSIS — Z0000 Encounter for general adult medical examination without abnormal findings: Secondary | ICD-10-CM | POA: Diagnosis not present

## 2020-09-23 DIAGNOSIS — Z125 Encounter for screening for malignant neoplasm of prostate: Secondary | ICD-10-CM | POA: Diagnosis not present

## 2020-09-23 DIAGNOSIS — E785 Hyperlipidemia, unspecified: Secondary | ICD-10-CM

## 2020-09-23 HISTORY — DX: Elevated blood-pressure reading, without diagnosis of hypertension: R03.0

## 2020-09-23 LAB — POCT URINALYSIS DIP (PROADVANTAGE DEVICE)
Bilirubin, UA: NEGATIVE
Blood, UA: NEGATIVE
Glucose, UA: NEGATIVE mg/dL
Ketones, POC UA: NEGATIVE mg/dL
Leukocytes, UA: NEGATIVE
Nitrite, UA: NEGATIVE
Protein Ur, POC: NEGATIVE mg/dL
Specific Gravity, Urine: 1.015
Urobilinogen, Ur: 0.2
pH, UA: 6.5 (ref 5.0–8.0)

## 2020-09-23 MED ORDER — ROSUVASTATIN CALCIUM 20 MG PO TABS: ORAL_TABLET | ORAL | 3 refills | Status: DC

## 2020-09-23 NOTE — Progress Notes (Signed)
Subjective:   HPI  Russell Sharp is a 60 y.o. male who presents for Chief Complaint  Patient presents with  . Annual Exam    Physical with fasting labs     Patient Care Team: Guiselle Mian, Leward Quan as PCP - General (Family Medicine) Sees dentist Sees eye doctor  Concerns: Last visit 2020.  Since then he had 2 different job changes and was out of insurance for a while.  Feels fine.  He is taking fish oil over-the-counter as he ran out of Crestor.  Blood pressure was elevated today on triage-he notes some recent job stress, trying to handle some things so he attributes his elevated blood pressure to that.  He eats very healthy.  He cooks meals most of his, eats a lot of vegetables.  He avoids added salt.  He is active and exercises.  Had covid shot x 3 at [facility]  Reviewed their medical, surgical, family, social, medication, and allergy history and updated chart as appropriate.  Past Medical History:  Diagnosis Date  . Allergy    seasonal  . Chest pain 2008   EKG and stress testing  . Hyperlipidemia   . Hypogonadism male    prior Androgel use x 81mo . Pneumonia    hospitalized age 60 . Wears glasses     Past Surgical History:  Procedure Laterality Date  . COLONOSCOPY  04/2015   normal, repeat 2026; Dr. CSilvano Rusk . ORIF TIBIA PLATEAU Left 03/14/2013   Procedure: OPEN REDUCTION INTERNAL FIXATION (ORIF) LEFT TIBIAL PLATEAU FRACTURE ;  Surgeon: SAugustin Schooling MD;  [place]  Service: Orthopedics;  Laterality: Left;    Family History  Problem Relation Age of Onset  . Cancer Mother        died of lung cancer  . Hypertension Mother   . Other Father        died in the [employer] . Diabetes Father   . Hyperlipidemia Brother   . Cancer Maternal Aunt        died of lung cancer  . Stroke Neg Hx   . Heart disease Neg Hx   . Colon cancer Neg Hx   . Rectal cancer Neg Hx   . Stomach cancer Neg Hx   . Colon polyps Neg Hx      Current Outpatient Medications:   .  aspirin EC 81 MG tablet, Take 1 tablet (81 mg total) by mouth daily., Disp: 90 tablet, Rfl: 3 .  rosuvastatin (CRESTOR) 20 MG tablet, TAKE 1 TABLET(20 MG) BY MOUTH DAILY, Disp: 90 tablet, Rfl: 3  No Known Allergies     Review of Systems Constitutional: -fever, -chills, -sweats, -unexpected weight change, -decreased appetite, -fatigue Allergy: -sneezing, -itching, -congestion Dermatology: -changing moles, --rash, -lumps ENT: -runny nose, -ear pain, -sore throat, -hoarseness, -sinus pain, -teeth pain, - ringing in ears, -hearing loss, -nosebleeds Cardiology: -chest pain, -palpitations, -swelling, -difficulty breathing when lying flat, -waking up short of breath Respiratory: -cough, -shortness of breath, -difficulty breathing with exercise or exertion, -wheezing, -coughing up blood Gastroenterology: -abdominal pain, -nausea, -vomiting, -diarrhea, -constipation, -blood in stool, -changes in bowel movement, -difficulty swallowing or eating Hematology: -bleeding, -bruising  Musculoskeletal: -joint aches, -muscle aches, -joint swelling, -back pain, -neck pain, -cramping, -changes in gait Ophthalmology: denies vision changes, eye redness, itching, discharge Urology: -burning with urination, -difficulty urinating, -blood in urine, -urinary frequency, -urgency, -incontinence Neurology: -headache, -weakness, -tingling, -numbness, -memory loss, -falls, -dizziness Psychology: -depressed mood, -agitation, -sleep problems Male  GU: no testicular mass, pain, no lymph nodes swollen, no swelling, no rash.     Objective:  BP 140/90   Pulse 90   Ht '5\' 6"'  (1.676 m)   Wt 150 lb 3.2 oz (68.1 kg)   SpO2 97%   BMI 24.24 kg/m   General appearance: alert, no distress, WD/WN, African American male Skin: unremarkable HEENT: normocephalic, conjunctiva/corneas normal, sclerae anicteric, PERRLA, EOMi, nares patent, no discharge or erythema, pharynx normal Oral cavity: MMM, tongue normal, teeth normal Neck:  supple, no lymphadenopathy, no thyromegaly, no masses, normal ROM, no bruits Chest: non tender, normal shape and expansion Heart: RRR, normal S1, S2, no murmurs Lungs: CTA bilaterally, no wheezes, rhonchi, or rales Abdomen: +bs, soft, non tender, non distended, no masses, no hepatomegaly, no splenomegaly, no bruits Back: non tender, normal ROM, no scoliosis Musculoskeletal: upper extremities non tender, no obvious deformity, normal ROM throughout, lower extremities non tender, no obvious deformity, normal ROM throughout Extremities: no edema, no cyanosis, no clubbing Pulses: 2+ symmetric, upper and lower extremities, normal cap refill Neurological: alert, oriented x 3, CN2-12 intact, strength normal upper extremities and lower extremities, sensation normal throughout, DTRs 2+ throughout, no cerebellar signs, gait normal Psychiatric: normal affect, behavior normal, pleasant  GU: normal male external genitalia,uncircumcised, nontender, no masses, no hernia, no lymphadenopathy Rectal: anus WNL, prostate mildly enlarged,no nodules    EKG  Indication physical and elevated blood pressure, rate 78 bpm, PR interval 148 ms, QRS 82 ms, QTC 435 ms axis 51 degrees, normal sinus rhythm    Assessment and Plan :   Encounter Diagnoses  Name Primary?  . Encounter for health maintenance examination in adult Yes  . Screening for prostate cancer   . Mixed hyperlipidemia   . Vaccine counseling   . Need for Td vaccine   . Hyperlipidemia, unspecified hyperlipidemia type   . Elevated blood-pressure reading without diagnosis of hypertension   . Screening for colon cancer   . Pure hypercholesterolemia     Today you had a preventative care visit or wellness visit.    Topics today may have included healthy lifestyle, diet, exercise, preventative care, vaccinations, sick and well care, proper use of emergency dept and after hours care, as well as other concerns.     Recommendations: Continue to return  yearly for your annual wellness and preventative care visits.  This gives [place] a chance to discuss healthy lifestyle, exercise, vaccinations, review your chart record, and perform screenings where appropriate.  I recommend you see your eye doctor yearly for routine vision care.  I recommend you see your dentist yearly for routine dental care including hygiene visits twice yearly.   Vaccination recommendations were reviewed  Counseled on the Td (tetanus, diptheria) vaccine.  Vaccine information sheet given. Td vaccine given after consent obtained.  I recommend a yearly flu shot in the fall.  You report being up to date on Covid vaccines  Shingles vaccine:  I recommend you have a shingles vaccine to help prevent shingles or herpes zoster outbreak.   Please call your insurer to inquire about coverage for the Shingrix vaccine given in 2 doses.   Some insurers cover this vaccine after age 81, some cover this after age 69.  If your insurer covers this, then call to schedule appointment to have this vaccine here.    Screening for cancer: Colon cancer screening:  I reviewed your colonoscopy on file that is up to date from 2016 You were given stool cards kit to return for  hemoccult screening  We discussed PSA, prostate exam, and prostate cancer screening risks/benefits.     Skin cancer screening: Check your skin regularly for new changes, growing lesions, or other lesions of concern Come in for evaluation if you have skin lesions of concern.  Lung cancer screening: If you have a greater than 30 pack year history of tobacco use, then you qualify for lung cancer screening with a chest CT scan  We currently don't have screenings for other cancers besides breast, cervical, colon, and lung cancers.  If you have a strong family history of cancer or have other cancer screening concerns, please let me know.    Bone health: Get at least 150 minutes of aerobic exercise weekly Get weight bearing  exercise at least once weekly   Heart health: Get at least 150 minutes of aerobic exercise weekly Limit alcohol It is important to maintain a healthy blood pressure and healthy cholesterol numbers   Separate significant issues discussed: Hyperlipidemia-labs today, lost to follow-up since 2020.   Restart statin  Elevated blood pressure today without prior diagnosis of high blood pressure. -He will check home blood pressures as we discussed and get me readings in 2 weeks.  He has not had a blood pressure problem in the past but his numbers are little elevated today.  We discussed lifestyle modifications, limiting salt, continuing exercise and healthy diet.   Russell Sharp was seen today for annual exam.  Diagnoses and all orders for this visit:  Encounter for health maintenance examination in adult -     Comprehensive metabolic panel -     CBC -     Lipid panel -     PSA -     EKG 12-Lead -     POCT Urinalysis DIP (Proadvantage Device)  Screening for prostate cancer -     PSA  Mixed hyperlipidemia -     Lipid panel  Vaccine counseling  Need for Td vaccine  Hyperlipidemia, unspecified hyperlipidemia type  Elevated blood-pressure reading without diagnosis of hypertension  Screening for colon cancer -     Fecal occult blood, imunochemical([facility]/Sun[facility])  Pure hypercholesterolemia -     rosuvastatin (CRESTOR) 20 MG tablet; TAKE 1 TABLET(20 MG) BY MOUTH DAILY  Other orders -     Td : Tetanus/diphtheria >7yo Preservative  free     Follow-up pending labs, yearly for physical

## 2020-09-23 NOTE — Patient Instructions (Signed)
Today you had a preventative care visit or wellness visit.    Topics today may have included healthy lifestyle, diet, exercise, preventative care, vaccinations, sick and well care, proper use of emergency dept and after hours care, as well as other concerns.     Recommendations: Continue to return yearly for your annual wellness and preventative care visits.  This gives us a chance to discuss healthy lifestyle, exercise, vaccinations, review your chart record, and perform screenings where appropriate.  I recommend you see your eye doctor yearly for routine vision care.  I recommend you see your dentist yearly for routine dental care including hygiene visits twice yearly.   Vaccination recommendations were reviewed  Counseled on the Td (tetanus, diptheria) vaccine.  Vaccine information sheet given. Td vaccine given after consent obtained.  I recommend a yearly flu shot in the fall.  You report being up to date on Covid vaccines  Shingles vaccine:  I recommend you have a shingles vaccine to help prevent shingles or herpes zoster outbreak.   Please call your insurer to inquire about coverage for the Shingrix vaccine given in 2 doses.   Some insurers cover this vaccine after age 50, some cover this after age 60.  If your insurer covers this, then call to schedule appointment to have this vaccine here.    Screening for cancer: Colon cancer screening:  I reviewed your colonoscopy on file that is up to date from 2016 You were given stool cards kit to return for hemoccult screening  We discussed PSA, prostate exam, and prostate cancer screening risks/benefits.     Skin cancer screening: Check your skin regularly for new changes, growing lesions, or other lesions of concern Come in for evaluation if you have skin lesions of concern.  Lung cancer screening: If you have a greater than 30 pack year history of tobacco use, then you qualify for lung cancer screening with a chest CT scan  We  currently don't have screenings for other cancers besides breast, cervical, colon, and lung cancers.  If you have a strong family history of cancer or have other cancer screening concerns, please let me know.    Bone health: Get at least 150 minutes of aerobic exercise weekly Get weight bearing exercise at least once weekly   Heart health: Get at least 150 minutes of aerobic exercise weekly Limit alcohol It is important to maintain a healthy blood pressure and healthy cholesterol numbers   Separate significant issues discussed: Hyperlipidemia-labs today, lost to follow-up since 2020.   Restart statin  Elevated blood pressure today without prior diagnosis of high blood pressure. -He will check home blood pressures as we discussed and get me readings in 2 weeks.  He has not had a blood pressure problem in the past but his numbers are little elevated today.  We discussed lifestyle modifications, limiting salt, continuing exercise and healthy diet. 

## 2020-09-24 LAB — COMPREHENSIVE METABOLIC PANEL
ALT: 22 IU/L (ref 0–44)
AST: 25 IU/L (ref 0–40)
Albumin/Globulin Ratio: 1.5 (ref 1.2–2.2)
Albumin: 4.8 g/dL (ref 3.8–4.9)
Alkaline Phosphatase: 52 IU/L (ref 44–121)
BUN/Creatinine Ratio: 12 (ref 9–20)
BUN: 14 mg/dL (ref 6–24)
Bilirubin Total: 0.6 mg/dL (ref 0.0–1.2)
CO2: 18 mmol/L — ABNORMAL LOW (ref 20–29)
Calcium: 9.8 mg/dL (ref 8.7–10.2)
Chloride: 104 mmol/L (ref 96–106)
Creatinine, Ser: 1.15 mg/dL (ref 0.76–1.27)
Globulin, Total: 3.2 g/dL (ref 1.5–4.5)
Glucose: 80 mg/dL (ref 65–99)
Potassium: 5.1 mmol/L (ref 3.5–5.2)
Sodium: 142 mmol/L (ref 134–144)
Total Protein: 8 g/dL (ref 6.0–8.5)
eGFR: 74 mL/min/{1.73_m2} (ref >59)

## 2020-09-24 LAB — CBC
Hematocrit: 47.8 % (ref 37.5–51.0)
Hemoglobin: 16.5 g/dL (ref 13.0–17.7)
MCH: 30.1 pg (ref 26.6–33.0)
MCHC: 34.5 g/dL (ref 31.5–35.7)
MCV: 87 fL (ref 79–97)
Platelets: 237 10*3/uL (ref 150–450)
RBC: 5.48 x10E6/uL (ref 4.14–5.80)
RDW: 12.5 % (ref 11.6–15.4)
WBC: 4.5 10*3/uL (ref 3.4–10.8)

## 2020-09-24 LAB — LIPID PANEL
Chol/HDL Ratio: 3.7 ratio (ref 0.0–5.0)
Cholesterol, Total: 270 mg/dL — ABNORMAL HIGH (ref 100–199)
HDL: 73 mg/dL (ref >39)
LDL Chol Calc (NIH): 176 mg/dL — ABNORMAL HIGH (ref 0–99)
Triglycerides: 120 mg/dL (ref 0–149)
VLDL Cholesterol Cal: 21 mg/dL (ref 5–40)

## 2020-09-24 LAB — PSA: Prostate Specific Ag, Serum: 0.7 ng/mL (ref 0.0–4.0)

## 2020-09-30 ENCOUNTER — Emergency Department (HOSPITAL_BASED_OUTPATIENT_CLINIC_OR_DEPARTMENT_OTHER): Payer: PRIVATE HEALTH INSURANCE

## 2020-09-30 ENCOUNTER — Other Ambulatory Visit: Payer: Self-pay

## 2020-09-30 ENCOUNTER — Emergency Department (HOSPITAL_BASED_OUTPATIENT_CLINIC_OR_DEPARTMENT_OTHER)
Admission: EM | Admit: 2020-09-30 | Discharge: 2020-09-30 | Disposition: A | Discharge status: Home / Self Care | Payer: PRIVATE HEALTH INSURANCE | Attending: Emergency Medicine | Admitting: Emergency Medicine

## 2020-09-30 ENCOUNTER — Encounter (HOSPITAL_BASED_OUTPATIENT_CLINIC_OR_DEPARTMENT_OTHER): Payer: Self-pay

## 2020-09-30 DIAGNOSIS — Z7982 Long term (current) use of aspirin: Secondary | ICD-10-CM | POA: Diagnosis not present

## 2020-09-30 DIAGNOSIS — Y92410 Unspecified street and highway as the place of occurrence of the external cause: Secondary | ICD-10-CM | POA: Diagnosis not present

## 2020-09-30 DIAGNOSIS — Z8546 Personal history of malignant neoplasm of prostate: Secondary | ICD-10-CM | POA: Insufficient documentation

## 2020-09-30 DIAGNOSIS — R079 Chest pain, unspecified: Secondary | ICD-10-CM

## 2020-09-30 DIAGNOSIS — R000 Tachycardia, unspecified: Secondary | ICD-10-CM | POA: Insufficient documentation

## 2020-09-30 DIAGNOSIS — R0789 Other chest pain: Secondary | ICD-10-CM | POA: Insufficient documentation

## 2020-09-30 DIAGNOSIS — Z87891 Personal history of nicotine dependence: Secondary | ICD-10-CM | POA: Insufficient documentation

## 2020-09-30 MED ORDER — ACETAMINOPHEN 325 MG PO TABS: 650.0000 mg | ORAL_TABLET | Freq: Four times a day (QID) | ORAL | 0 refills | Status: DC | PRN
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
## 2020-09-30 NOTE — ED Notes (Signed)
Patient is being followed my PCP for his blood pressure and will be following up with them to determine if he will be started on bp meds.
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
## 2020-09-30 NOTE — Discharge Instructions (Signed)
Return for worsening chest pain or if you realize that occurs when you are trying to go upstairs or run down the street.  Please follow-up with your family doctor.

## 2020-10-01 ENCOUNTER — Encounter: Payer: Self-pay | Admitting: Medical

## 2020-10-01 ENCOUNTER — Ambulatory Visit: Payer: No Typology Code available for payment source | Admitting: Medical

## 2020-10-01 VITALS — BP 158/106 | HR 105 | Ht 66.0 in | Wt 153.2 lb

## 2020-10-01 DIAGNOSIS — Z8249 Family history of ischemic heart disease and other diseases of the circulatory system: Secondary | ICD-10-CM | POA: Diagnosis not present

## 2020-10-01 DIAGNOSIS — R400 Somnolence: Secondary | ICD-10-CM | POA: Diagnosis not present

## 2020-10-01 DIAGNOSIS — M546 Pain in thoracic spine: Secondary | ICD-10-CM

## 2020-10-01 DIAGNOSIS — R0683 Snoring: Secondary | ICD-10-CM | POA: Diagnosis not present

## 2020-10-01 DIAGNOSIS — I10 Essential (primary) hypertension: Secondary | ICD-10-CM | POA: Diagnosis not present

## 2020-10-01 HISTORY — DX: Pain in thoracic spine: M54.6

## 2020-10-01 MED ORDER — AMLODIPINE BESYLATE 5 MG PO TABS: 5.0000 mg | ORAL_TABLET | Freq: Every day | ORAL | 1 refills | Status: DC
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
## 2020-10-02 ENCOUNTER — Telehealth: Payer: Self-pay
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
## 2020-10-03 NOTE — Telephone Encounter (Signed)
He didn't seem to be in a lot of pain when I saw him.    Did he work the last few days?  It would be difficult to write him out if he actually worked the last few days.  If he didn't work the last few days, then go ahead and write out thru Sunday.

## 2020-10-03 NOTE — Telephone Encounter (Signed)
Pt has not worked this week, he works the weekend shift.  Note written.  Pt informed

## 2020-10-07 LAB — SPECIMEN STATUS REPORT

## 2020-10-15 ENCOUNTER — Encounter: Payer: Self-pay | Admitting: Medical

## 2020-10-15 ENCOUNTER — Other Ambulatory Visit: Payer: Self-pay

## 2020-10-15 ENCOUNTER — Ambulatory Visit: Payer: No Typology Code available for payment source | Admitting: Medical

## 2020-10-15 ENCOUNTER — Telehealth: Payer: Self-pay | Admitting: Medical

## 2020-10-15 VITALS — BP 142/94 | HR 95 | Ht 66.0 in | Wt 155.4 lb

## 2020-10-15 DIAGNOSIS — M549 Dorsalgia, unspecified: Secondary | ICD-10-CM | POA: Diagnosis not present

## 2020-10-15 DIAGNOSIS — M62830 Muscle spasm of back: Secondary | ICD-10-CM | POA: Diagnosis not present

## 2020-10-15 HISTORY — DX: Muscle spasm of back: M62.830

## 2020-10-15 MED ORDER — METHOCARBAMOL 500 MG PO TABS: 500.0000 mg | ORAL_TABLET | Freq: Every day | ORAL | 0 refills | Status: DC

## 2020-10-15 MED ORDER — NAPROXEN 500 MG PO TABS: 500.0000 mg | ORAL_TABLET | Freq: Two times a day (BID) | ORAL | 0 refills | Status: DC

## 2020-10-15 NOTE — Telephone Encounter (Signed)
Pt states he is suppose to have a sleep study he was wanting to know about where and when he was suppose to have it

## 2020-10-15 NOTE — Telephone Encounter (Signed)
See msg

## 2020-10-15 NOTE — Telephone Encounter (Signed)
[facility] will be contacting him as the order has been placed.

## 2020-10-15 NOTE — Patient Instructions (Signed)
Recommendations  Use neck and shoulder stretches daily  Use a memory foam type pillow when lying on back or side.  Avoid big bulky pillows.  Begin Robaxin muscle relaxer in the evening the next few nights, then as needed for muscle soreness/spasm  Begin Naprosyn anti-inflammatory medication twice daily for about 5 days, then go to as needed  Either schedule appointment with a chiropractor, or let us know if you need referral per insurance requirements to see a chiropractor      Chiropractic and Physical Therapy [facility] [address], [place], [place] [address] Phone: [phone]   [facility] [address] [place], [place] [address] Phone: [phone]   [facility] Dr. Sela Hua [address] [place], [place] [address] [phone]    Massage Therapy: Sharen Hint [facility] [address] [place] [address] [place], [place] [address] [phone] [email or web address]
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
## 2020-10-22 ENCOUNTER — Ambulatory Visit: Payer: No Typology Code available for payment source | Admitting: Medical

## 2020-11-24 ENCOUNTER — Other Ambulatory Visit: Payer: Self-pay | Admitting: Medical

## 2021-04-10 ENCOUNTER — Telehealth: Payer: Self-pay | Admitting: Internal Medicine

## 2021-04-10 NOTE — Telephone Encounter (Signed)
Left message for pt to call back to schedule follow-up on BP. It was too high last visit

## 2021-04-27 ENCOUNTER — Other Ambulatory Visit: Payer: Self-pay | Admitting: Medical

## 2021-08-26 ENCOUNTER — Other Ambulatory Visit: Payer: Self-pay | Admitting: Medical

## 2021-09-24 ENCOUNTER — Telehealth: Payer: Self-pay | Admitting: Medical

## 2021-09-24 ENCOUNTER — Encounter: Payer: No Typology Code available for payment source | Admitting: Medical

## 2021-09-24 NOTE — Telephone Encounter (Signed)
I did not see where he had no showed before.  ?

## 2021-09-24 NOTE — Progress Notes (Deleted)
Shingrix ?Colon 16 ?Snore? Sleep? ? ?

## 2021-09-24 NOTE — Telephone Encounter (Signed)
This patient no showed for their appointment today.Which of the following is necessary for this patient.  ? ?A) No follow-up necessary  ? ?B) Follow-up urgent. Locate Patient Immediately.  ? ?C) Follow-up necessary. Contact patient and Schedule visit in ____ Days.  ? ?D) Follow-up Advised. Contact patient and Schedule visit in ____ Days.  ? ?E) Please Send no show letter to patient. Charge no show fee if no show was a CPE.   ? ?Has he no showed prior? ?

## 2021-10-02 ENCOUNTER — Encounter: Payer: Self-pay | Admitting: Medical

## 2021-10-30 ENCOUNTER — Other Ambulatory Visit: Payer: Self-pay | Admitting: Medical

## 2021-10-30 DIAGNOSIS — E7800 Pure hypercholesterolemia, unspecified: Secondary | ICD-10-CM

## 2021-11-02 NOTE — Telephone Encounter (Signed)
Left message for pt to call back to schedule an appt before any refills can be given ?

## 2021-11-29 ENCOUNTER — Other Ambulatory Visit: Payer: Self-pay | Admitting: Medical

## 2021-11-30 NOTE — Telephone Encounter (Signed)
Left detailed message for pt to call back to schedule an appt first ?

## 2021-12-11 ENCOUNTER — Telehealth: Payer: Self-pay | Admitting: Medical

## 2021-12-11 ENCOUNTER — Other Ambulatory Visit: Payer: Self-pay

## 2021-12-11 MED ORDER — AMLODIPINE BESYLATE 5 MG PO TABS: ORAL_TABLET | ORAL | 1 refills | Status: DC

## 2021-12-11 NOTE — Telephone Encounter (Signed)
Pt called and made a medcheck appt for June 1 needs a refill for his norvasc please send to the  [id number] - [place], [place] - [address] AT [facility] [address] [facility] [address]

## 2021-12-24 ENCOUNTER — Encounter: Payer: No Typology Code available for payment source | Admitting: Medical
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
## 2022-10-20 ENCOUNTER — Encounter (HOSPITAL_BASED_OUTPATIENT_CLINIC_OR_DEPARTMENT_OTHER): Payer: Self-pay

## 2022-10-20 ENCOUNTER — Emergency Department (HOSPITAL_BASED_OUTPATIENT_CLINIC_OR_DEPARTMENT_OTHER)
Admission: EM | Admit: 2022-10-20 | Discharge: 2022-10-20 | Disposition: A | Discharge status: Home / Self Care | Payer: BLUE CROSS/BLUE SHIELD | Attending: Emergency Medicine | Admitting: Emergency Medicine

## 2022-10-20 ENCOUNTER — Other Ambulatory Visit: Payer: Self-pay

## 2022-10-20 DIAGNOSIS — T7840XA Allergy, unspecified, initial encounter: Secondary | ICD-10-CM | POA: Diagnosis present

## 2022-10-20 DIAGNOSIS — Z87891 Personal history of nicotine dependence: Secondary | ICD-10-CM | POA: Diagnosis not present

## 2022-10-20 MED ORDER — DEXAMETHASONE 4 MG PO TABS
10.0000 mg | ORAL_TABLET | Freq: Once | ORAL | Status: AC
  Administered 2022-10-20: 10 mg via ORAL
  Filled 2022-10-20: qty 3

## 2022-10-20 MED ORDER — EPINEPHRINE 0.3 MG/0.3ML IJ SOAJ: 0.3000 mg | INTRAMUSCULAR | 1 refills | Status: DC | PRN

## 2022-10-20 NOTE — ED Triage Notes (Signed)
Says 3 weeks ago he had an episode of generalized hives and itching. Took benadryl and it resolved.   Yesterday noticed some facial swelling and throat tenderness. Has not taken anymore benadryl. Not taking any daily meds. No known allergies.

## 2022-10-20 NOTE — Discharge Instructions (Addendum)
You were evaluated in the Emergency Department and after careful evaluation, we did not find any emergent condition requiring admission or further testing in the hospital.  Your exam/testing today was overall reassuring.  Continue Benadryl at home as needed.  Use the EpiPen only for severe symptoms as we discussed.  Follow-up with the allergy specialist.  Please return to the Emergency Department if you experience any worsening of your condition.  Thank you for allowing [place] to be a part of your care.

## 2022-10-20 NOTE — ED Notes (Signed)
Reviewed AVS with patient, patient expressed understanding of directions, denies further questions at this time. 

## 2022-10-20 NOTE — ED Provider Notes (Signed)
[facility] Emergency Department Provider Note MRN:  [id number]  Arrival date & time: 10/20/22     Chief Complaint   Allergic Reaction   History of Present Illness   Russell Sharp is a 62 y.o. year-old male with no pertinent past medical history presenting to the ED with chief complaint of allergic reaction.  A couple weeks ago patient was undressing at work and suddenly felt flushed and then broke out in hives.  He took Benadryl and it got better.  Happened again yesterday while at work.  Was a bit worse.  Endorses facial swelling and sensation of throat closure yesterday.  Took Benadryl last night and feeling better today.  Review of Systems  A thorough review of systems was obtained and all systems are negative except as noted in the HPI and PMH.   Patient's Health History    Past Medical History:  Diagnosis Date   Allergy    seasonal   Chest pain 2008   EKG and stress testing   Hyperlipidemia    Hypogonadism male    prior Androgel use x 22mo   Pneumonia    hospitalized age 13   Wears glasses     Past Surgical History:  Procedure Laterality Date   COLONOSCOPY  04/2015   normal, repeat 2026; Dr. Silvano Rusk   ORIF TIBIA PLATEAU Left 03/14/2013   Procedure: OPEN REDUCTION INTERNAL FIXATION (ORIF) LEFT TIBIAL PLATEAU FRACTURE ;  Surgeon: Augustin Schooling, MD;  Location: [facility];  Service: Orthopedics;  Laterality: Left;    Family History  Problem Relation Age of Onset   Cancer Mother        died of lung cancer   Hypertension Mother    Other Father        died in the [place]   Diabetes Father    Hyperlipidemia Brother    Cancer Maternal Aunt        died of lung cancer   Stroke Neg Hx    Heart disease Neg Hx    Colon cancer Neg Hx    Rectal cancer Neg Hx    Stomach cancer Neg Hx    Colon polyps Neg Hx     Social History   Socioeconomic History   Marital status: Married    Spouse name: Not on file   Number of children: Not on file    Years of education: Not on file   Highest education level: Not on file  Occupational History   Occupation: Glass blower/designer    Employer: [employer]    Comment: [employer] - make tubing for pipes  Tobacco Use   Smoking status: Former    Packs/day: 0.25    Years: 3.00    Additional pack years: 0.00    Total pack years: 0.75    Types: Cigars, Cigarettes    Quit date: 02/24/2011    Years since quitting: 11.6   Smokeless tobacco: Never  Vaping Use   Vaping Use: Never used  Substance and Sexual Activity   Alcohol use: Yes    Alcohol/week: 2.0 standard drinks of alcohol    Types: 1 Glasses of wine, 1 Cans of beer per week    Comment: 2   Drug use: No   Sexual activity: Not on file  Other Topics Concern   Not on file  Social History Narrative   Married, 3 children, exercise - stays active, swims, walks at the park, lots of movement at work.  Glass blower/designer at [employer], makes  parts for health care equipment.   09/2020   Social Determinants of Health   Financial Resource Strain: Not on file  Food Insecurity: Not on file  Transportation Needs: Not on file  Physical Activity: Not on file  Stress: Not on file  Social Connections: Not on file  Intimate Partner Violence: Not on file     Physical Exam   Vitals:   10/20/22 0524  BP: (!) 170/112  Pulse: (!) 105  Resp: 19  Temp: 97.8 F (36.6 C)  SpO2: 98%    CONSTITUTIONAL: Well-appearing, NAD NEURO/PSYCH:  Alert and oriented x 3, no focal deficits EYES:  eyes equal and reactive ENT/NECK:  no LAD, no JVD CARDIO: Regular rate, well-perfused, normal S1 and S2 PULM:  CTAB no wheezing or rhonchi GI/GU:  non-distended, non-tender MSK/SPINE:  No gross deformities, no edema SKIN:  no rash, atraumatic   *Additional and/or pertinent findings included in MDM below  Diagnostic and Interventional Summary    EKG Interpretation  Date/Time:    Ventricular Rate:    PR Interval:    QRS Duration:   QT Interval:    QTC Calculation:    R Axis:     Text Interpretation:         Labs Reviewed - No data to display  No orders to display    Medications  dexamethasone (DECADRON) tablet 10 mg (has no administration in time range)     Procedures  /  Critical Care Procedures  ED Course and Medical Decision Making  Initial Impression and Ddx Patient is without objective signs or symptoms of allergic reaction at this time but the history is quite suggestive.  He still feels swollen in the face.  Still has occasional itchiness.  Will provide a dose of Decadron.  Nothing to suggest dangerous angioedema or anaphylaxis at this time.  Largely without complaints.  Past medical/surgical history that increases complexity of ED encounter: None  Interpretation of Diagnostics Laboratory and/or imaging options to aid in the diagnosis/care of the patient were considered.  After careful history and physical examination, it was determined that there was no indication for diagnostics at this time.  Patient Reassessment and Ultimate Disposition/Management     Discharge  Patient management required discussion with the following services or consulting groups:  None  Complexity of Problems Addressed Acute complicated illness or Injury  Additional Data Reviewed and Analyzed Further history obtained from: None  Additional Factors Impacting ED Encounter Risk Prescriptions  Barth Kirks. Sedonia Small, MD [place] [email or web address]  Final Clinical Impressions(s) / ED Diagnoses     ICD-10-CM   1. Allergic reaction, initial encounter  T78.40XA       ED Discharge Orders          Ordered    EPINEPHrine 0.3 mg/0.3 mL IJ SOAJ injection  As needed        10/20/22 0538             Discharge Instructions Discussed with and Provided to Patient:    Discharge Instructions      You were evaluated in the Emergency Department and after careful evaluation, we did not find any emergent  condition requiring admission or further testing in the hospital.  Your exam/testing today was overall reassuring.  Continue Benadryl at home as needed.  Use the EpiPen only for severe symptoms as we discussed.  Follow-up with the allergy specialist.  Please return to the Emergency Department if you experience any worsening of  your condition.  Thank you for allowing [place] to be a part of your care.       Maudie Flakes, MD 10/20/22 [phone]

## 2023-12-30 ENCOUNTER — Telehealth: Admitting: Physician Assistant

## 2023-12-30 DIAGNOSIS — M549 Dorsalgia, unspecified: Secondary | ICD-10-CM
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
## 2023-12-30 NOTE — Patient Instructions (Signed)
  Burleigh Carp, thank you for joining Marciana Settle, PA-C for today's virtual visit.  While this provider is not your primary care provider (PCP), if your PCP is located in our provider database this encounter information will be shared with them immediately following your visit.   A [facility] MyChart account gives you access to today's visit and all your visits, tests, and labs performed at [facility] " click here if you don't have a [facility] MyChart account or go to [email or web address]  Consent: (Patient) Russell Sharp provided verbal consent for this virtual visit at the beginning of the encounter.  Current Medications:  Current Outpatient Medications:    amLODipine  (NORVASC ) 5 MG tablet, TAKE 1 TABLET(5 MG) BY MOUTH DAILY, Disp: 30 tablet, Rfl: 1   aspirin  EC 81 MG tablet, Take 1 tablet (81 mg total) by mouth daily., Disp: 90 tablet, Rfl: 3   EPINEPHrine  0.3 mg/0.3 mL IJ SOAJ injection, Inject 0.3 mg into the muscle as needed for anaphylaxis., Disp: 1 each, Rfl: 1   methocarbamol  (ROBAXIN ) 500 MG tablet, Take 1 tablet (500 mg total) by mouth at bedtime., Disp: 15 tablet, Rfl: 0   naproxen  (NAPROSYN ) 500 MG tablet, Take 1 tablet (500 mg total) by mouth 2 (two) times daily with a meal., Disp: 30 tablet, Rfl: 0   rosuvastatin  (CRESTOR ) 20 MG tablet, TAKE 1 TABLET(20 MG) BY MOUTH DAILY, Disp: 90 tablet, Rfl: 3   Medications ordered in this encounter:  No orders of the defined types were placed in this encounter.    *If you need refills on other medications prior to your next appointment, please contact your pharmacy*  Follow-Up: Call back or seek an in-person evaluation if the symptoms worsen or if the condition fails to improve as anticipated.  [facility] [phone]  Other Instructions You will need to be seen by your employers Worker's Comp./occupational medicine provider to be evaluated.  If you have any worsening symptoms you may report to your  nearest emergency department. thank you for choosing [facility].   If you have been instructed to have an in-person evaluation today at a local Urgent Care facility, please use the link below. It will take you to a list of all of our available [facility], including address, phone number and hours of operation. Please do not delay care.  [facility]  If you or a family member do not have a primary care provider, use the link below to schedule a visit and establish care. When you choose a [facility] primary care physician or advanced practice provider, you gain a long-term partner in health. Find a Primary Care Provider  Learn more about [facility] in-office and virtual care options:  - Get Care Now

## 2024-02-27 ENCOUNTER — Ambulatory Visit (INDEPENDENT_AMBULATORY_CARE_PROVIDER_SITE_OTHER): Admitting: Internal Medicine

## 2024-02-27 ENCOUNTER — Encounter: Payer: Self-pay | Admitting: Internal Medicine

## 2024-02-27 VITALS — BP 136/74 | HR 82 | Temp 98.0°F | Ht 66.0 in | Wt 144.0 lb

## 2024-02-27 DIAGNOSIS — E291 Testicular hypofunction: Secondary | ICD-10-CM

## 2024-02-27 DIAGNOSIS — Z87898 Personal history of other specified conditions: Secondary | ICD-10-CM | POA: Insufficient documentation

## 2024-02-27 DIAGNOSIS — R0683 Snoring: Secondary | ICD-10-CM

## 2024-02-27 DIAGNOSIS — I10 Essential (primary) hypertension: Secondary | ICD-10-CM | POA: Diagnosis not present

## 2024-02-27 DIAGNOSIS — Z0001 Encounter for general adult medical examination with abnormal findings: Secondary | ICD-10-CM | POA: Diagnosis not present

## 2024-02-27 DIAGNOSIS — R400 Somnolence: Secondary | ICD-10-CM

## 2024-02-27 DIAGNOSIS — Z1211 Encounter for screening for malignant neoplasm of colon: Secondary | ICD-10-CM

## 2024-02-27 DIAGNOSIS — E782 Mixed hyperlipidemia: Secondary | ICD-10-CM

## 2024-02-27 DIAGNOSIS — Z125 Encounter for screening for malignant neoplasm of prostate: Secondary | ICD-10-CM

## 2024-02-27 DIAGNOSIS — Z119 Encounter for screening for infectious and parasitic diseases, unspecified: Secondary | ICD-10-CM

## 2024-02-27 NOTE — Assessment & Plan Note (Signed)
 History of hypogonadism treated with testosterone  gel. Current symptoms include low libido. Reassessment of testosterone  levels is needed to evaluate contribution to symptoms. Order testosterone  level test and schedule follow-up for testosterone  testing at 8 to 10 AM.

## 2024-02-27 NOTE — Patient Instructions (Addendum)
 VISIT SUMMARY: Today, you came in for your annual physical exam. We discussed your history of hypogonadism, high cholesterol, high blood pressure, sleep issues, and past episodes of syncope. We also reviewed your current lifestyle and dietary habits. A comprehensive blood work panel was ordered, and we discussed various health maintenance strategies.  YOUR PLAN: -ADULT WELLNESS VISIT: An annual physical exam was conducted to assess your overall health. We ordered comprehensive blood work, including tests for PSA, hepatitis, HIV, thyroid, blood count, metabolic panel, cholesterol, and testosterone . Please schedule your blood work for 8 to 10 AM while fasting. We also ordered a Cologuard test for colon cancer screening. Continue with your current lifestyle and dietary habits, and we will review the results at your next visit.  -ESSENTIAL HYPERTENSION: Your blood pressure is well-controlled without medication, likely due to your stress management and dietary changes. Essential hypertension is high blood pressure with no identifiable cause. Continue with your current lifestyle modifications to maintain control.  -HYPERLIPIDEMIA: Hyperlipidemia means you have high levels of cholesterol in your blood. You have made good dietary changes by reducing beef intake and eating more [place] and chicken. Continue with these dietary habits to manage your cholesterol levels.  -HYPOGONADISM WITH ERECTILE DYSFUNCTION: Hypogonadism is a condition where your body doesn't produce enough testosterone . This can lead to symptoms like low libido and erectile dysfunction. We will reassess your testosterone  levels with a blood test scheduled for 8 to 10 AM. Based on the results, we will discuss further treatment options.  -SNORING AND SLEEP DISTURBANCE: You have reported snoring and sleep disturbances, which may suggest intermittent sleep apnea. Sleep apnea is a condition where your breathing stops and starts during sleep. Continue  monitoring your sleep using the Sleep Lab app or Apple Watch and focus on improving your sleep quality.  -HISTORY OF SYNCOPE: Syncope is a temporary loss of consciousness, often related to insufficient blood flow to the brain. Your past episodes may be linked to seizures or sleep apnea. Monitoring your sleep quality can help prevent potential seizures. We will continue to explore this connection.  -ALLERGIC RHINITIS (SUSPECTED): Allergic rhinitis is an allergic reaction that causes sneezing, congestion, and a runny nose. Environmental factors like air conditioning and dust may contribute to your symptoms. Use HEPA filters in your home, and consider wet dusting and cleaning to reduce allergens. A nasal irrigation device may also help relieve your symptoms.  INSTRUCTIONS: Please schedule your comprehensive blood work for 8 to 10 AM while fasting. We will review the results at your next visit. Continue monitoring your sleep quality and follow the lifestyle and dietary recommendations discussed today.  Welcome aboard!   Today's visit was a valuable first step in understanding your health and starting your personalized care journey. We discussed your medical history and medications in detail. Given the extensive information, we prioritized addressing your most pressing concerns.  We understood those concerns to be:  new pt (Pt is present to est care with pcp. Has had PT from work accident in the left side hip and arm .)   Building a Complete Picture  To create the most effective care plan possible, we may need additional information from previous providers. We encouraged you to gather any relevant medical records for your next visit. This will help us  build a more complete picture and develop a personalized plan together. In the meantime, we'll address your immediate concerns and provide resources to help you manage all of your medical issues.  We encourage you to use  MyChart to review these efforts, and  to help us  find and correct any omissions or errors in your medical chart.  Managing Your Health Over Time  Managing every aspect of your health in a single visit isn't always feasible, but that's okay.  We addressed your most pressing concerns today and charted a course for future care. Acute conditions or preventive care measures may require further attention.  We encourage you to schedule a follow-up visit at your earliest convenience to discuss any unresolved issues.  We strongly encourage participation in annual preventive care visits to help us  develop a more thorough understanding of your health and to help you maintain optimal wellness - please inquire about scheduling your next one with us  at your earliest convenience.  Your Satisfaction Matters  It was a pleasure seeing you today!  Your health and satisfaction will always be my top priorities. If you believe your experience today was worthy of a 5-star rating, I'd be grateful for your feedback!  Russell KANDICE Cone, MD  Next Steps  Schedule Follow-Up:  We recommend a follow-up appointment in 1 year for your next wellness visit.  If you develop any new problems, want to address any medical issues, or your condition worsens before then, please call us  for an appointment or seek emergency care. Preventive Care:  Don't forget to schedule your annual preventive care visit!  Please review your attached preventive care information. Make sure to arrange appointments for dental and vision routine screening, and use nightly nasal saline mist to keep your sinuses clear. Medical Information Release:  For any relevant medical information we don't have, please sign a release form at the front desk so we can obtain it for your records. Lab & X-ray Appointments:  Scheduled any incomplete lab tests today or call us  to schedule.  X-Rays can be done without an appointment at [facility] at [facility] ([address]), M-F 8:30am-noon or 1pm-5pm.   Just tell them you're there for X-rays ordered by Dr. Cone.  We'll receive the results and contact you by phone or MyChart to discuss next steps.  Making the Most of Our Focused (20 minute) Appointments:  [x]   Clearly state your top concerns at the beginning of the visit to focus our discussion [x]   If you anticipate you will need more time, please inform the front desk during scheduling - we can book multiple appointments in the same week. [x]   If you have transportation problems- use our convenient video appointments or ask about transportation support. [x]   We can get down to business faster if you use MyChart to update information before the visit and submit non-urgent questions before your visit. Thank you for taking the time to provide details through MyChart.  Let our nurse know and she can import this information into your encounter documents.  Arrival and Wait Times: [x]   Arriving on time ensures that everyone receives prompt attention. [x]   Early morning (8a) and afternoon (1p) appointments tend to have shortest wait times. [x]   Unfortunately, we cannot delay appointments for late arrivals or hold slots during phone calls.  Thank you for collaborating with us  to prioritize your health. We look forward to serving you!  Bring to Your Next Appointment  Medications: Please bring all your medication bottles to your next appointment to ensure we have an accurate record of your prescriptions. Health Diaries: If you're monitoring any health conditions at home, keeping a diary of your readings can be very helpful for  discussions at your next appointment.  Reviewing Your Records  Please Review this early draft of your clinical notes below and the final encounter summary tomorrow on MyChart after its been completed.   Encounter for annual general medical examination with abnormal findings in adult -     Lipid panel; Future -     Comprehensive metabolic panel with GFR; Future -     CBC  with Differential/Platelet; Future -     TSH Rfx on Abnormal to Free T4; Future -     Hemoglobin A1c; Future -     PSA; Future  Primary hypertension Assessment & Plan: Blood pressure is well-controlled without medication. Previous hypertension was attributed to stress and dietary habits.   Snoring Assessment & Plan: Reports of snoring and sleep disturbances. Previous sleep study was negative for sleep apnea. Possible intermittent sleep apnea is suspected, potentially affecting overall health. Recommend continued sleep monitoring using Sleep Lab app or Apple Watch. Encourage focus on improving sleep quality.  History of syncope with potential causes including seizures or sleep apnea. Previous blackout episodes during driving and childhood incidents suggest possible seizure disorder. Sleep quality may be linked to syncope episodes. Encourage monitoring of sleep quality to prevent potential seizures. Discuss potential link between sleep quality and syncope.   Daytime somnolence Assessment & Plan: Reports of snoring and sleep disturbances. Previous sleep study was negative for sleep apnea. Possible intermittent sleep apnea is suspected, potentially affecting overall health. Recommend continued sleep monitoring using Sleep Lab app or Apple Watch. Encourage focus on improving sleep quality.  History of syncope with potential causes including seizures or sleep apnea. Previous blackout episodes during driving and childhood incidents suggest possible seizure disorder. Sleep quality may be linked to syncope episodes. Encourage monitoring of sleep quality to prevent potential seizures. Discuss potential link between sleep quality and syncope.   History of syncope Assessment & Plan: Reports of snoring and sleep disturbances. Previous sleep study was negative for sleep apnea. Possible intermittent sleep apnea is suspected, potentially affecting overall health. Recommend continued sleep monitoring using  Sleep Lab app or Apple Watch. Encourage focus on improving sleep quality.  History of syncope with potential causes including seizures or sleep apnea. Previous blackout episodes during driving and childhood incidents suggest possible seizure disorder. Sleep quality may be linked to syncope episodes. Encourage monitoring of sleep quality to prevent potential seizures. Discuss potential link between sleep quality and syncope.   Hypogonadism in male Assessment & Plan: History of hypogonadism treated with testosterone  gel. Current symptoms include low libido. Reassessment of testosterone  levels is needed to evaluate contribution to symptoms. Order testosterone  level test and schedule follow-up for testosterone  testing at 8 to 10 AM.  Orders: -     Testosterone ; Future  Screening for malignant neoplasm of colon -     Cologuard; Future  Screening examination for infectious disease -     HIV Antibody (routine testing w rflx); Future -     HCV Ab w Reflex to Quant PCR; Future  Screening for malignant neoplasm of prostate -     PSA; Future  Mixed hyperlipidemia Assessment & Plan: Previously elevated cholesterol levels. Dietary modifications include reduced beef intake and increased [place] and chicken consumption.      Getting Answers and Following Up  Simple Questions & Concerns: For quick questions or basic follow-up after your visit, reach us  at [phone] or MyChart messaging. Complex Concerns: If your concern is more complex, scheduling an appointment might be best. Discuss this with  the staff to find the most suitable option. Lab & Imaging Results: We'll contact you directly if results are abnormal or you don't use MyChart. Most normal results will be on MyChart within 2-3 business days, with a review message from Dr. Jesus. Haven't heard back in 2 weeks? Need results sooner? Contact us  at [phone]. Referrals: Our referral coordinator will manage specialist referrals. The  specialist's office should contact you within 2 weeks to schedule an appointment. Call us  if you haven't heard from them after 2 weeks.  Staying Connected  MyChart: Activate your MyChart for the fastest way to access results and message us . See the last page of this paperwork for instructions on how to activate.  Billing  X-ray & Lab Orders: These are billed by separate companies. Contact the invoicing company directly for questions or concerns. Visit Charges: Discuss any billing inquiries with our administrative services team.  Feedback & Satisfaction  Share Your Experience: We strive for your satisfaction! If you have any complaints, or preferably compliments, please let Dr. Jesus know directly or contact our Practice Administrators, Manuelita Rubin or [employer], by asking at the front desk.   Scheduling Tips  Shorter Wait Times: 8 am and 1 pm appointments often have the quickest wait times. Longer Appointments: If you need more time during your visit, talk to the front desk. Due to insurance regulations, multiple back-to-back appointments might be necessary.   Medical Screening Exam A medical screening exam (MSE) helps to determine whether you need immediate medical treatment relating to any number of symptoms you are having. This type of exam may be done in an emergency department, an urgent care setting, or your health care provider's office. Depending on your symptoms and severity, you may need additional tests or medical therapy. It is important to note that an MSE does not necessarily mean that you will need or receive further medical testing or interventions if your symptoms are not deemed to be medically urgent (emergent). Tell a health care provider about: Any allergies you have. All medicines you are taking, including vitamins, herbs, eye drops, creams, and over-the-counter medicines. Any problems you or family members have had with anesthetic medicines. Any bleeding  problems you have. Any surgeries you have had. Any medical conditions you have. Whether you are pregnant or may be pregnant. What happens during the test? During the exam, a health care provider does a short, often focused, physical exam and asks about your medical history to assess: Your current symptoms. Your overall health. Your need for possible further medical intervention. What can I expect after the test? If you have a regular health care provider, make an appointment for a follow-up visit with him or her. If you do not have a regular health care provider, ask about resources in your community. Your medical screening exam may determine that: You do not need emergency treatment at this time. You need treatment right away. You need to be transferred to another medical center. This may happen if you need an emergent specialist or consultant that is not available at the medical center you are at. You need to have more tests. A medical specialist may be consulted if needed. Get help right away if: Your condition gets worse. You develop new or troubling symptoms before you see your health care provider. These symptoms may represent a serious problem that is an emergency. Do not wait to see if the symptoms will go away. Get medical help right away. Call your local emergency services (  911 in the [place] Do not drive yourself to the hospital. Summary A medical screening exam helps to determine whether you need medical treatment right away. This type of exam may be done in an emergency department, an urgent care setting, or your health care provider's office. During the exam, a health care provider does a short physical exam and asks about your current symptoms and overall health. Depending on the exam, more tests or therapies may be ordered. However, an MSE does not necessarily mean that you will have further medical testing if your symptoms are not deemed to be urgent. If you need further care  that is not offered at your current medical center, you may need to be transferred to another facility. This information is not intended to replace advice given to you by your health care provider. Make sure you discuss any questions you have with your health care provider. Document Revised: 03/25/2021 Document Reviewed: 11/20/2020 Elsevier Patient Education  2024 [employer]           ALLERGY MANAGEMENT PLAN  This plan is designed to help manage your allergic rhinitis (nasal allergies) effectively. Follow these steps daily for best results.  Sinus saline sprays- use nightly, and after sneezing episodes or exposure to allergen.  Insert deeply and spray mist into nose while leaning over sink at 45 degrees,  while gently breathing. Also blow out onto tissue while leaning forward 45 degrees. Once daily, after a sinus rinse, use sensimist.  Just before bedtime is best. This only needed if allergies acting up.  If this is inadequate add-on once daily for levocetirizine / xyzal 5 mg for nondrowsy antihistamine Take benadryl 25 mg at bedtime also if allergic mucus is persisting  When allergies cause chronic swelling in sinuses, it leads to sinus infections:    DAILY TREATMENT ROUTINE   Time of Day Treatment Steps  Morning 1. Saline Nasal Spray - Use to cleanse nasal passages 2. Xyzal (levocetirizine) - Take one tablet daily   Throughout Day Saline Nasal Spray - Use 2 additional times (mid-day and afternoon)   Evening/Bedtime 1. Saline Nasal Rinse - Thoroughly clean nasal passages 2. Flonase Sensimist - Apply after nasal rinse 3. Benadryl (diphenhydramine) - Take 25mg  if experiencing persistent congestion    PROPER TECHNIQUE GUIDE       Saline Nasal Spray/Rinse Technique: Lean forward over sink at a 45-degree angle Turn head slightly to one side Insert spray tip into upper nostril Spray gently while breathing lightly through your nose Repeat on other side Gently blow nose to  clear excess solution Use saline spray 3 times daily to keep nasal passages moist and clear allergens.       Flonase Sensimist Technique: Shake bottle gently before each use Prime the bottle if it's new or hasn't been used for a week Tilt your head forward slightly Insert tip into nostril, pointing away from the center of your nose Spray while inhaling gently Repeat in other nostril Use Flonase Sensimist once daily, preferably at bedtime after using saline rinse. It may take several days of regular use to feel maximum benefit.   WHY FLONASE SENSIMIST?   Benefits of Flonase Sensimist:  Alcohol-free and scent-free formula - gentler on sensitive nasal passages Fine mist application - more comfortable with less dripping down throat Effectively relieves nasal congestion, sneezing, runny nose, and even eye symptoms 24-hour relief with once-daily dosing Uses a more potent form of fluticasone that works at a lower dose Less liquid per spray means  less discomfort  UNDERSTANDING YOUR MEDICATIONS   Medication How It Works Important Notes  Flonase Sensimist (fluticasone furoate) Reduces inflammation in nasal passages, addressing the underlying cause of allergy symptoms - Takes several days for full effect - Use daily for best results - Safe for long-term use   Xyzal (levocetirizine) Blocks histamine to reduce allergy symptoms like sneezing and itching - Take at the same time each day - May cause drowsiness in some people - Once-daily dosing   Benadryl (diphenhydramine) Antihistamine that provides additional relief for breakthrough symptoms - Causes drowsiness - Use only at bedtime - For occasional use when needed   Saline Spray/Rinse Physically removes allergens and moistens nasal passages - Safe to use frequently - Improves effectiveness of other treatments - Reduces nasal irritation    CONTACT YOUR PROVIDER IF: Your symptoms do not improve after 1-2 weeks of following this plan You  develop sinus pain with fever or green/yellow discharge You experience frequent nosebleeds You develop new or worsening symptoms You have questions about your treatment plan     ADDITIONAL ALLERGY MANAGEMENT TIPS   HELPFUL STRATEGIES: ?? Keep windows closed during high pollen seasons ??? Use allergen-proof covers for pillows and mattresses ?? Vacuum regularly with a HEPA filter vacuum ?? Shower and change clothes after spending time outdoors ?? Check local pollen counts and limit outdoor time when counts are high ?? Stay well-hydrated to help keep mucous membranes moist      VISIT SUMMARY: Today, you came in for your annual physical exam. We discussed your history of hypogonadism, high cholesterol, high blood pressure, sleep issues, and past episodes of syncope. We also reviewed your current lifestyle and dietary habits. A comprehensive blood work panel was ordered, and we discussed various health maintenance strategies.  YOUR PLAN: -ADULT WELLNESS VISIT: An annual physical exam was conducted to assess your overall health. We ordered comprehensive blood work, including tests for PSA, hepatitis, HIV, thyroid, blood count, metabolic panel, cholesterol, and testosterone . Please schedule your blood work for 8 to 10 AM while fasting. We also ordered a Cologuard test for colon cancer screening. Continue with your current lifestyle and dietary habits, and we will review the results at your next visit.  -ESSENTIAL HYPERTENSION: Your blood pressure is well-controlled without medication, likely due to your stress management and dietary changes. Essential hypertension is high blood pressure with no identifiable cause. Continue with your current lifestyle modifications to maintain control.  -HYPERLIPIDEMIA: Hyperlipidemia means you have high levels of cholesterol in your blood. You have made good dietary changes by reducing beef intake and eating more [place] and chicken. Continue with these dietary  habits to manage your cholesterol levels.  -HYPOGONADISM WITH ERECTILE DYSFUNCTION: Hypogonadism is a condition where your body doesn't produce enough testosterone . This can lead to symptoms like low libido and erectile dysfunction. We will reassess your testosterone  levels with a blood test scheduled for 8 to 10 AM. Based on the results, we will discuss further treatment options.  -SNORING AND SLEEP DISTURBANCE: You have reported snoring and sleep disturbances, which may suggest intermittent sleep apnea. Sleep apnea is a condition where your breathing stops and starts during sleep. Continue monitoring your sleep using the Sleep Lab app or Apple Watch and focus on improving your sleep quality.  -HISTORY OF SYNCOPE: Syncope is a temporary loss of consciousness, often related to insufficient blood flow to the brain. Your past episodes may be linked to seizures or sleep apnea. Monitoring your sleep quality can help prevent potential seizures.  We will continue to explore this connection.  -ALLERGIC RHINITIS (SUSPECTED): Allergic rhinitis is an allergic reaction that causes sneezing, congestion, and a runny nose. Environmental factors like air conditioning and dust may contribute to your symptoms. Use HEPA filters in your home, and consider wet dusting and cleaning to reduce allergens. A nasal irrigation device may also help relieve your symptoms.  INSTRUCTIONS: Please schedule your comprehensive blood work for 8 to 10 AM while fasting. We will review the results at your next visit. Continue monitoring your sleep quality and follow the lifestyle and dietary recommendations discussed today.  Welcome aboard!   Today's visit was a valuable first step in understanding your health and starting your personalized care journey. We discussed your medical history and medications in detail. Given the extensive information, we prioritized addressing your most pressing concerns.  We understood those concerns to be:   new pt (Pt is present to est care with pcp. Has had PT from work accident in the left side hip and arm .)   Building a Complete Picture  To create the most effective care plan possible, we may need additional information from previous providers. We encouraged you to gather any relevant medical records for your next visit. This will help us  build a more complete picture and develop a personalized plan together. In the meantime, we'll address your immediate concerns and provide resources to help you manage all of your medical issues.  We encourage you to use MyChart to review these efforts, and to help us  find and correct any omissions or errors in your medical chart.  Managing Your Health Over Time  Managing every aspect of your health in a single visit isn't always feasible, but that's okay.  We addressed your most pressing concerns today and charted a course for future care. Acute conditions or preventive care measures may require further attention.  We encourage you to schedule a follow-up visit at your earliest convenience to discuss any unresolved issues.  We strongly encourage participation in annual preventive care visits to help us  develop a more thorough understanding of your health and to help you maintain optimal wellness - please inquire about scheduling your next one with us  at your earliest convenience.  Your Satisfaction Matters  It was a pleasure seeing you today!  Your health and satisfaction will always be my top priorities. If you believe your experience today was worthy of a 5-star rating, I'd be grateful for your feedback!  Russell KANDICE Cone, MD  Next Steps  Schedule Follow-Up:  We recommend a follow-up appointment in 1 year for your next wellness visit.  If you develop any new problems, want to address any medical issues, or your condition worsens before then, please call us  for an appointment or seek emergency care. Preventive Care:  Don't forget to schedule your annual  preventive care visit!  Please review your attached preventive care information. Make sure to arrange appointments for dental and vision routine screening, and use nightly nasal saline mist to keep your sinuses clear. Medical Information Release:  For any relevant medical information we don't have, please sign a release form at the front desk so we can obtain it for your records. Lab & X-ray Appointments:  Scheduled any incomplete lab tests today or call us  to schedule.  X-Rays can be done without an appointment at [facility] at [facility] ([address]), M-F 8:30am-noon or 1pm-5pm.  Just tell them you're there for X-rays ordered by Dr. Cone.  We'll  receive the results and contact you by phone or MyChart to discuss next steps.  Making the Most of Our Focused (20 minute) Appointments:  [x]   Clearly state your top concerns at the beginning of the visit to focus our discussion [x]   If you anticipate you will need more time, please inform the front desk during scheduling - we can book multiple appointments in the same week. [x]   If you have transportation problems- use our convenient video appointments or ask about transportation support. [x]   We can get down to business faster if you use MyChart to update information before the visit and submit non-urgent questions before your visit. Thank you for taking the time to provide details through MyChart.  Let our nurse know and she can import this information into your encounter documents.  Arrival and Wait Times: [x]   Arriving on time ensures that everyone receives prompt attention. [x]   Early morning (8a) and afternoon (1p) appointments tend to have shortest wait times. [x]   Unfortunately, we cannot delay appointments for late arrivals or hold slots during phone calls.  Thank you for collaborating with us  to prioritize your health. We look forward to serving you!  Bring to Your Next Appointment  Medications: Please bring all your  medication bottles to your next appointment to ensure we have an accurate record of your prescriptions. Health Diaries: If you're monitoring any health conditions at home, keeping a diary of your readings can be very helpful for discussions at your next appointment.  Reviewing Your Records  Please Review this early draft of your clinical notes below and the final encounter summary tomorrow on MyChart after its been completed.   Primary hypertension  Snoring  Daytime somnolence  History of syncope  Hypogonadism in male -     Testosterone ; Future  Encounter for annual general medical examination with abnormal findings in adult -     Lipid panel; Future -     Comprehensive metabolic panel with GFR; Future -     CBC with Differential/Platelet; Future -     TSH Rfx on Abnormal to Free T4; Future -     Hemoglobin A1c; Future -     PSA; Future  Screening for malignant neoplasm of colon -     Cologuard; Future  Screening examination for infectious disease -     HIV Antibody (routine testing w rflx); Future -     HCV Ab w Reflex to Quant PCR; Future  Screening for malignant neoplasm of prostate -     PSA; Future     Getting Answers and Following Up  Simple Questions & Concerns: For quick questions or basic follow-up after your visit, reach us  at [phone] or MyChart messaging. Complex Concerns: If your concern is more complex, scheduling an appointment might be best. Discuss this with the staff to find the most suitable option. Lab & Imaging Results: We'll contact you directly if results are abnormal or you don't use MyChart. Most normal results will be on MyChart within 2-3 business days, with a review message from Dr. Jesus. Haven't heard back in 2 weeks? Need results sooner? Contact us  at [phone]. Referrals: Our referral coordinator will manage specialist referrals. The specialist's office should contact you within 2 weeks to schedule an appointment. Call us  if you  haven't heard from them after 2 weeks.  Staying Connected  MyChart: Activate your MyChart for the fastest way to access results and message us . See the last page of this paperwork for instructions  on how to activate.  Billing  X-ray & Lab Orders: These are billed by separate companies. Contact the invoicing company directly for questions or concerns. Visit Charges: Discuss any billing inquiries with our administrative services team.  Feedback & Satisfaction  Share Your Experience: We strive for your satisfaction! If you have any complaints, or preferably compliments, please let Dr. Jesus know directly or contact our Practice Administrators, Manuelita Rubin or [employer], by asking at the front desk.   Scheduling Tips  Shorter Wait Times: 8 am and 1 pm appointments often have the quickest wait times. Longer Appointments: If you need more time during your visit, talk to the front desk. Due to insurance regulations, multiple back-to-back appointments might be necessary.   Medical Screening Exam A medical screening exam (MSE) helps to determine whether you need immediate medical treatment relating to any number of symptoms you are having. This type of exam may be done in an emergency department, an urgent care setting, or your health care provider's office. Depending on your symptoms and severity, you may need additional tests or medical therapy. It is important to note that an MSE does not necessarily mean that you will need or receive further medical testing or interventions if your symptoms are not deemed to be medically urgent (emergent). Tell a health care provider about: Any allergies you have. All medicines you are taking, including vitamins, herbs, eye drops, creams, and over-the-counter medicines. Any problems you or family members have had with anesthetic medicines. Any bleeding problems you have. Any surgeries you have had. Any medical conditions you have. Whether you are  pregnant or may be pregnant. What happens during the test? During the exam, a health care provider does a short, often focused, physical exam and asks about your medical history to assess: Your current symptoms. Your overall health. Your need for possible further medical intervention. What can I expect after the test? If you have a regular health care provider, make an appointment for a follow-up visit with him or her. If you do not have a regular health care provider, ask about resources in your community. Your medical screening exam may determine that: You do not need emergency treatment at this time. You need treatment right away. You need to be transferred to another medical center. This may happen if you need an emergent specialist or consultant that is not available at the medical center you are at. You need to have more tests. A medical specialist may be consulted if needed. Get help right away if: Your condition gets worse. You develop new or troubling symptoms before you see your health care provider. These symptoms may represent a serious problem that is an emergency. Do not wait to see if the symptoms will go away. Get medical help right away. Call your local emergency services (911 in the [place] Do not drive yourself to the hospital. Summary A medical screening exam helps to determine whether you need medical treatment right away. This type of exam may be done in an emergency department, an urgent care setting, or your health care provider's office. During the exam, a health care provider does a short physical exam and asks about your current symptoms and overall health. Depending on the exam, more tests or therapies may be ordered. However, an MSE does not necessarily mean that you will have further medical testing if your symptoms are not deemed to be urgent. If you need further care that is not offered at your current medical center, you  may need to be transferred to another  facility. This information is not intended to replace advice given to you by your health care provider. Make sure you discuss any questions you have with your health care provider. Document Revised: 03/25/2021 Document Reviewed: 11/20/2020 Elsevier Patient Education  2024 [employer].

## 2024-02-27 NOTE — Assessment & Plan Note (Signed)
 Reports of snoring and sleep disturbances. Previous sleep study was negative for sleep apnea. Possible intermittent sleep apnea is suspected, potentially affecting overall health. Recommend continued sleep monitoring using Sleep Lab app or Apple Watch. Encourage focus on improving sleep quality.  History of syncope with potential causes including seizures or sleep apnea. Previous blackout episodes during driving and childhood incidents suggest possible seizure disorder. Sleep quality may be linked to syncope episodes. Encourage monitoring of sleep quality to prevent potential seizures. Discuss potential link between sleep quality and syncope.

## 2024-02-27 NOTE — Progress Notes (Signed)
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 risk for this type of infection. Your sexual activity has changed since you were last screened, and you are at increased risk for chlamydia or gonorrhea. Ask your health care provider if you are at risk. Ask your health care provider about whether you are at high risk for HIV. Your health care provider may recommend a prescription medicine to help prevent HIV infection. If you choose to take medicine to prevent HIV, you should first get tested for HIV. You should then be tested every 3 months for as long as you are taking the medicine. Follow these instructions at home: Alcohol use Do not drink alcohol if your health care provider tells you not to drink. If you drink alcohol: Limit how much you have to 0-2 drinks a day. Know how much alcohol is in your drink. In the [place] one drink equals one 12 oz bottle of beer (355 mL), one 5 oz glass of wine (148 mL), or one 1 oz glass of hard  liquor (44 mL). Lifestyle Do not use any products that contain nicotine or tobacco. These products include cigarettes, chewing tobacco, and vaping devices, such as e-cigarettes. If you need help quitting, ask your health care provider. Do not use street drugs. Do not share needles. Ask your health care provider for help if you need support or information about quitting drugs. General instructions Schedule regular health, dental, and eye exams. Stay current with your vaccines. Tell your health care provider if: You often feel depressed. You have ever been abused or do not feel safe at home. Summary Adopting a healthy lifestyle and getting preventive care are important in promoting health and wellness. Follow your health care provider's instructions about healthy diet, exercising, and getting tested or screened for diseases. Follow your health care provider's instructions on monitoring your cholesterol and blood pressure. This information is not intended to replace advice given to you by your health care provider. Make sure you discuss any questions you have with your health care provider. Document Revised: 12/01/2020 Document Reviewed: 12/01/2020 Elsevier Patient Education  2024 [employer].

## 2024-02-27 NOTE — Assessment & Plan Note (Signed)
 Previously elevated cholesterol levels. Dietary modifications include reduced beef intake and increased [place] and chicken consumption.

## 2024-02-27 NOTE — Assessment & Plan Note (Signed)
 Blood pressure is well-controlled without medication. Previous hypertension was attributed to stress and dietary habits.

## 2024-03-01 ENCOUNTER — Other Ambulatory Visit (INDEPENDENT_AMBULATORY_CARE_PROVIDER_SITE_OTHER)

## 2024-03-01 ENCOUNTER — Ambulatory Visit: Payer: Self-pay | Admitting: Internal Medicine

## 2024-03-01 DIAGNOSIS — Z0001 Encounter for general adult medical examination with abnormal findings: Secondary | ICD-10-CM | POA: Diagnosis not present

## 2024-03-01 DIAGNOSIS — Z125 Encounter for screening for malignant neoplasm of prostate: Secondary | ICD-10-CM | POA: Diagnosis not present

## 2024-03-01 DIAGNOSIS — E291 Testicular hypofunction: Secondary | ICD-10-CM | POA: Diagnosis not present

## 2024-03-01 DIAGNOSIS — Z119 Encounter for screening for infectious and parasitic diseases, unspecified: Secondary | ICD-10-CM

## 2024-03-01 LAB — CBC WITH DIFFERENTIAL/PLATELET
Basophils Absolute: 0 K/uL (ref 0.0–0.1)
Basophils Relative: 0.8 % (ref 0.0–3.0)
Eosinophils Absolute: 0.1 K/uL (ref 0.0–0.7)
Eosinophils Relative: 1.2 % (ref 0.0–5.0)
HCT: 43.2 % (ref 39.0–52.0)
Hemoglobin: 14.2 g/dL (ref 13.0–17.0)
Lymphocytes Relative: 50.8 % — ABNORMAL HIGH (ref 12.0–46.0)
Lymphs Abs: 2.4 K/uL (ref 0.7–4.0)
MCHC: 32.9 g/dL (ref 30.0–36.0)
MCV: 89.9 fl (ref 78.0–100.0)
Monocytes Absolute: 0.4 K/uL (ref 0.1–1.0)
Monocytes Relative: 8.7 % (ref 3.0–12.0)
Neutro Abs: 1.8 K/uL (ref 1.4–7.7)
Neutrophils Relative %: 38.5 % — ABNORMAL LOW (ref 43.0–77.0)
Platelets: 222 K/uL (ref 150.0–400.0)
RBC: 4.8 Mil/uL (ref 4.22–5.81)
RDW: 13.9 % (ref 11.5–15.5)
WBC: 4.7 K/uL (ref 4.0–10.5)

## 2024-03-01 LAB — COMPREHENSIVE METABOLIC PANEL WITH GFR
ALT: 22 U/L (ref 0–53)
AST: 25 U/L (ref 0–37)
Albumin: 4.2 g/dL (ref 3.5–5.2)
Alkaline Phosphatase: 43 U/L (ref 39–117)
BUN: 13 mg/dL (ref 6–23)
CO2: 23 meq/L (ref 19–32)
Calcium: 9.3 mg/dL (ref 8.4–10.5)
Chloride: 101 meq/L (ref 96–112)
Creatinine, Ser: 0.94 mg/dL (ref 0.40–1.50)
GFR: 88.03 mL/min (ref >60.00)
Glucose, Bld: 116 mg/dL — ABNORMAL HIGH (ref 70–99)
Potassium: 3.8 meq/L (ref 3.5–5.1)
Sodium: 139 meq/L (ref 135–145)
Total Bilirubin: 0.4 mg/dL (ref 0.2–1.2)
Total Protein: 7 g/dL (ref 6.0–8.3)

## 2024-03-01 LAB — LIPID PANEL
Cholesterol: 251 mg/dL — ABNORMAL HIGH (ref 0–200)
HDL: 70.1 mg/dL (ref >39.00)
LDL Cholesterol: 155 mg/dL — ABNORMAL HIGH (ref 0–99)
NonHDL: 180.77
Total CHOL/HDL Ratio: 4
Triglycerides: 130 mg/dL (ref 0.0–149.0)
VLDL: 26 mg/dL (ref 0.0–40.0)

## 2024-03-01 LAB — TESTOSTERONE: Testosterone: 333.47 ng/dL (ref 300.00–890.00)

## 2024-03-01 LAB — HEMOGLOBIN A1C: Hgb A1c MFr Bld: 5 % (ref 4.6–6.5)

## 2024-03-01 LAB — PSA: PSA: 1.09 ng/mL (ref 0.10–4.00)

## 2024-03-01 NOTE — Progress Notes (Signed)
 I've reviewed the results and they are all essentially normal, except for cholesterol levels.

## 2024-03-02 LAB — HCV AB W REFLEX TO QUANT PCR: HCV Ab: NONREACTIVE

## 2024-03-02 LAB — TSH RFX ON ABNORMAL TO FREE T4: TSH: 0.521 u[IU]/mL (ref 0.450–4.500)

## 2024-03-02 LAB — HCV INTERPRETATION

## 2024-03-02 LAB — HIV ANTIBODY (ROUTINE TESTING W REFLEX): HIV 1&2 Ab, 4th Generation: NONREACTIVE

## 2024-03-19 ENCOUNTER — Ambulatory Visit (HOSPITAL_BASED_OUTPATIENT_CLINIC_OR_DEPARTMENT_OTHER): Admitting: Family Medicine

## 2024-03-21 ENCOUNTER — Ambulatory Visit: Admitting: Internal Medicine

## 2024-04-09 ENCOUNTER — Encounter: Payer: Self-pay | Admitting: Internal Medicine

## 2024-04-09 ENCOUNTER — Ambulatory Visit: Admitting: Internal Medicine

## 2024-04-09 VITALS — BP 136/78 | HR 97 | Temp 98.0°F | Ht 66.0 in | Wt 147.6 lb

## 2024-04-09 DIAGNOSIS — D72820 Lymphocytosis (symptomatic): Secondary | ICD-10-CM | POA: Insufficient documentation

## 2024-04-09 DIAGNOSIS — R251 Tremor, unspecified: Secondary | ICD-10-CM | POA: Insufficient documentation

## 2024-04-09 DIAGNOSIS — E7849 Other hyperlipidemia: Secondary | ICD-10-CM | POA: Diagnosis not present

## 2024-04-09 MED ORDER — ROSUVASTATIN CALCIUM 40 MG PO TABS: 40.0000 mg | ORAL_TABLET | Freq: Every day | ORAL | 3 refills | Status: AC

## 2024-04-09 MED ORDER — EZETIMIBE 10 MG PO TABS: 10.0000 mg | ORAL_TABLET | Freq: Every day | ORAL | 3 refills | Status: AC

## 2024-04-09 NOTE — Patient Instructions (Addendum)
 It was a pleasure seeing you today! Your health and satisfaction are our top priorities.  Russell Cone, MD  VISIT SUMMARY: During your visit, we discussed your abnormal lab results, intermittent hand tremors, and high cholesterol. We reviewed your medical history and current symptoms to develop a comprehensive plan for your health.  YOUR PLAN: -GENETIC HYPERCHOLESTEROLEMIA: Genetic hypercholesterolemia is a condition where your LDL cholesterol levels are very high due to genetic factors, increasing your risk of heart disease. We will start you on rosuvastatin , beginning with one quarter tablet daily for one week, then half a tablet daily for one week, and finally a full tablet daily. We also discussed potential side effects like muscle aches. Additionally, we will prescribe a second cholesterol-lowering medication to help achieve LDL levels under 40. Continue with dietary modifications, including using extra virgin olive oil, eating avocados, and consuming fish regularly. We will recheck your cholesterol levels in 1 to 3 months.  -CHRONIC LYMPHOCYTOSIS: Chronic lymphocytosis is a condition where you have a high number of lymphocytes and low neutrophils, which may indicate chronic inflammation. Your overall blood counts are normal, and it is unlikely to be leukemia. This condition may resolve on its own. We will repeat your blood count at your follow-up visit.  -INTERMITTENT HAND TREMOR: Intermittent hand tremor, likely essential tremor, is a condition where your hands shake, especially when you are stressed or tired. It is benign and does not require treatment unless it becomes bothersome. We will check your B12 levels to rule out a deficiency. If you wish, we can refer you to a neurologist for further evaluation.  INSTRUCTIONS: Please follow up in 1 to 3 months to recheck your cholesterol levels and repeat your blood count. Continue with the dietary modifications we discussed and start the  rosuvastatin  as prescribed. If your hand tremors become bothersome or if you have any concerns, let us  know, and we can consider a referral to a neurologist.  Your Providers PCP: Sharp Russell MATSU, MD,  [phone]) Referring Provider: Cone Russell MATSU, MD,  [phone])  NEXT STEPS: [x]  Early Intervention: Schedule sooner appointment, call our on-call services, or go to emergency room if there is any significant Increase in pain or discomfort New or worsening symptoms Sudden or severe changes in your health [x]  Flexible Follow-Up: We recommend a Return in about 3 months (around 07/09/2024) for review problems and medications check b12 and lipid panel.  repeat cbc.. for optimal routine care. This allows for progress monitoring and treatment adjustments. [x]  Preventive Care: Schedule your annual preventive care visit! It's typically covered by insurance and helps identify potential health issues early. [x]  Lab & X-ray Appointments: Incomplete tests scheduled today, or call to schedule. X-rays: [facility] (M-F, 8:30am-noon or 1pm-5pm). [x]  Medical Information Release: Sign a release form at front desk to obtain relevant medical information we don't have.  MAKING THE MOST OF OUR FOCUSED 20 MINUTE APPOINTMENTS: [x]   Clearly state your top concerns at the beginning of the visit to focus our discussion [x]   If you anticipate you will need more time, please inform the front desk during scheduling - we can book multiple appointments in the same week. [x]   If you have transportation problems- use our convenient video appointments or ask about transportation support. [x]   We can get down to business faster if you use MyChart to update information before the visit and submit non-urgent questions before your visit. Thank you for taking the time to provide details through MyChart.  Let our nurse know and she can import this information into your encounter documents.  Arrival and Wait  Times: [x]   Arriving on time ensures that everyone receives prompt attention. [x]   Early morning (8a) and afternoon (1p) appointments tend to have shortest wait times. [x]   Unfortunately, we cannot delay appointments for late arrivals or hold slots during phone calls.  Getting Answers and Following Up [x]   Simple Questions & Concerns: For quick questions or basic follow-up after your visit, reach us  at [phone] or MyChart messaging. [x]   Complex Concerns: If your concern is more complex, scheduling an appointment might be best. Discuss this with the staff to find the most suitable option. [x]   Lab & Imaging Results: We'll contact you directly if results are abnormal or you don't use MyChart. Most normal results will be on MyChart within 2-3 business days, with a review message from Dr. Jesus. Haven't heard back in 2 weeks? Need results sooner? Contact us  at [phone]. [x]   Referrals: Our referral coordinator will manage specialist referrals. The specialist's office should contact you within 2 weeks to schedule an appointment. Call us  if you haven't heard from them after 2 weeks.  Staying Connected [x]   MyChart: Activate your MyChart for the fastest way to access results and message us . See the last page of this paperwork for instructions on how to activate.  Bring to Your Next Appointment [x]   Medications: Please bring all your medication bottles to your next appointment to ensure we have an accurate record of your prescriptions. [x]   Health Diaries: If you're monitoring any health conditions at home, keeping a diary of your readings can be very helpful for discussions at your next appointment.  Billing [x]   X-ray & Lab Orders: These are billed by separate companies. Contact the invoicing company directly for questions or concerns. [x]   Visit Charges: Discuss any billing inquiries with our administrative services team.  Your Satisfaction Matters [x]   Share Your Experience: We  strive for your satisfaction! If you have any complaints, or preferably compliments, please let Dr. Jesus know directly or contact our Practice Administrators, Manuelita Rubin or [employer], by asking at the front desk.   Reviewing Your Records [x]   Review this early draft of your clinical encounter notes below and the final encounter summary tomorrow on MyChart after its been completed.  All orders placed so far are visible here: Lymphocytosis  Intermittent tremor  Other hyperlipidemia -     Rosuvastatin  Calcium ; Take 1 tablet (40 mg total) by mouth daily. Start 1/4 tablet daily x 1 week then 1/2 tablet daily for 1 week, then 1 tablet daily for full week.  Dispense: 90 tablet; Refill: 3 -     Ezetimibe ; Take 1 tablet (10 mg total) by mouth daily.  Dispense: 90 tablet; Refill: 3
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
## 2024-04-09 NOTE — Assessment & Plan Note (Signed)
 Genetic hypercholesterolemia   Severe genetic elevation of LDL cholesterol increases cardiovascular disease risk, likely due to genetic factors rather than diet. LDL levels under 40 can dissolve plaques; levels under 70-100 are controlled. Start rosuvastatin  with one quarter tablet daily for one week, then half a tablet daily for one week, then a full tablet daily. Discuss potential side effects, including muscle aches in 10-15% of patients. Prescribe a second cholesterol-lowering medication to achieve LDL levels under 40. Encourage dietary modifications with extra virgin olive oil, avocado, and regular fish consumption. Plan follow-up in 1 to 3 months to recheck cholesterol levels.

## 2024-04-09 NOTE — Assessment & Plan Note (Signed)
 Chronic lymphocytosis   Chronic lymphocytosis with low neutrophils and high lymphocytes suggests possible chronic inflammation. Overall blood counts are normal. Unlikely to be leukemia and may resolve spontaneously. Repeat blood count at follow-up visit.

## 2024-04-09 NOTE — Assessment & Plan Note (Signed)
 Lifelong intermittent hand tremor, likely essential tremor, is exacerbated by stress and fatigue. No family history or progression. Differential diagnosis includes B12 deficiency and lead exposure in childhood. Essential tremor is benign and does not require treatment unless bothersome. Check B12 levels to rule out deficiency. Consider referral to a neurologist for further evaluation if desired. Shared decision making for no further workup beyond B12 after discussion According to an invited review in The [place] Journal of Medicine, expensive tests such as serum ceruloplasmin, 24-hour urinary copper, slit-lamp examination for Kayser-Fleischer rings, thyroid function tests, and brain MRI are not routinely recommended for a man in his 15s with lifelong intermittent hand tremors, no family history, and only slight heel-to-toe ataxia, unless there are additional clinical features or laboratory findings suggestive of Wilson's disease, thyroid dysfunction, or other structural brain pathology.[1] The review emphasizes that Wilson's disease is rare in this age group and typically presents with more pronounced hepatic, neurologic, or psychiatric symptoms. Diagnostic testing for Wilson's disease should be reserved for patients with unexplained neurologic findings, progressive symptoms, or laboratory abnormalities. Thyroid function tests may be considered if there are symptoms or signs of hyperthyroidism, but routine screening in the absence of clinical suspicion is not supported. Brain MRI is indicated only if there is evidence of progression, new focal neurological deficits, or diagnostic uncertainty after initial evaluation. The rationale is to avoid unnecessary investigations and costs in patients with a stable, lifelong, and non-progressive tremor and minimal neurological findings.[1]

## 2024-07-09 ENCOUNTER — Ambulatory Visit: Admitting: Internal Medicine

## 2025-02-28 ENCOUNTER — Encounter: Admitting: Internal Medicine
